# Patient Record
Sex: Female | Born: 1950 | Race: White | Hispanic: No | Marital: Married | Smoking: Never smoker
Health system: Southern US, Community
[De-identification: ages and names within clinical notes are randomized; demographics above are authoritative.]

## PROBLEM LIST (undated history)

## (undated) DIAGNOSIS — T7840XA Allergy, unspecified, initial encounter: Secondary | ICD-10-CM

## (undated) DIAGNOSIS — B009 Herpesviral infection, unspecified: Secondary | ICD-10-CM

## (undated) DIAGNOSIS — J189 Pneumonia, unspecified organism: Secondary | ICD-10-CM

## (undated) DIAGNOSIS — C801 Malignant (primary) neoplasm, unspecified: Secondary | ICD-10-CM

## (undated) DIAGNOSIS — M1990 Unspecified osteoarthritis, unspecified site: Secondary | ICD-10-CM

## (undated) DIAGNOSIS — K219 Gastro-esophageal reflux disease without esophagitis: Secondary | ICD-10-CM

## (undated) DIAGNOSIS — E785 Hyperlipidemia, unspecified: Secondary | ICD-10-CM

## (undated) DIAGNOSIS — I10 Essential (primary) hypertension: Secondary | ICD-10-CM

## (undated) HISTORY — DX: Allergy, unspecified, initial encounter: T78.40XA

## (undated) HISTORY — DX: Essential (primary) hypertension: I10

## (undated) HISTORY — PX: NASAL SEPTUM SURGERY: SHX37

## (undated) HISTORY — PX: WISDOM TOOTH EXTRACTION: SHX21

## (undated) HISTORY — DX: Hyperlipidemia, unspecified: E78.5

## (undated) HISTORY — PX: OTHER SURGICAL HISTORY: SHX169

## (undated) HISTORY — DX: Herpesviral infection, unspecified: B00.9

---
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
## 1998-04-03 ENCOUNTER — Ambulatory Visit (HOSPITAL_COMMUNITY): Admission: RE | Admit: 1998-04-03 | Discharge: 1998-04-03 | Payer: Self-pay | Admitting: *Deleted

## 1999-04-24 ENCOUNTER — Encounter: Admission: RE | Admit: 1999-04-24 | Discharge: 1999-04-24 | Payer: Self-pay | Admitting: Obstetrics and Gynecology

## 1999-04-24 ENCOUNTER — Encounter: Payer: Self-pay | Admitting: Obstetrics and Gynecology

## 2001-01-19 ENCOUNTER — Encounter: Admission: RE | Admit: 2001-01-19 | Discharge: 2001-01-19 | Payer: Self-pay | Admitting: Obstetrics and Gynecology

## 2001-01-19 ENCOUNTER — Encounter: Payer: Self-pay | Admitting: Obstetrics and Gynecology

## 2007-10-19 ENCOUNTER — Ambulatory Visit: Payer: Self-pay | Admitting: Internal Medicine

## 2008-01-21 ENCOUNTER — Ambulatory Visit: Payer: Self-pay | Admitting: Internal Medicine

## 2008-06-03 ENCOUNTER — Ambulatory Visit: Payer: Self-pay | Admitting: Internal Medicine

## 2008-08-09 ENCOUNTER — Ambulatory Visit: Payer: Self-pay | Admitting: Internal Medicine

## 2008-10-14 ENCOUNTER — Ambulatory Visit: Payer: Self-pay | Admitting: Internal Medicine

## 2009-06-09 ENCOUNTER — Ambulatory Visit: Payer: Self-pay | Admitting: Internal Medicine

## 2009-12-08 ENCOUNTER — Ambulatory Visit: Payer: Self-pay | Admitting: Internal Medicine

## 2010-07-01 ENCOUNTER — Other Ambulatory Visit: Payer: Self-pay | Admitting: Internal Medicine

## 2010-07-03 ENCOUNTER — Telehealth: Payer: Self-pay | Admitting: Internal Medicine

## 2010-07-03 NOTE — Telephone Encounter (Signed)
Fax Rx for Triamcinolone cream 0.1% 60 grams no refill to use sparingly tid to pharmacy

## 2010-07-04 ENCOUNTER — Other Ambulatory Visit: Payer: Self-pay

## 2010-07-04 MED ORDER — TRIAMCINOLONE ACETONIDE 0.025 % EX OINT: TOPICAL_OINTMENT | Freq: Three times a day (TID) | CUTANEOUS | Status: AC

## 2010-07-23 ENCOUNTER — Other Ambulatory Visit: Payer: Self-pay | Admitting: Internal Medicine

## 2010-07-24 ENCOUNTER — Encounter: Payer: Self-pay | Admitting: Internal Medicine

## 2010-07-26 ENCOUNTER — Encounter: Payer: Self-pay | Admitting: Internal Medicine

## 2010-07-29 ENCOUNTER — Other Ambulatory Visit: Payer: Self-pay | Admitting: Internal Medicine

## 2010-07-30 ENCOUNTER — Other Ambulatory Visit: Payer: Self-pay | Admitting: Internal Medicine

## 2010-07-30 ENCOUNTER — Other Ambulatory Visit: Payer: BC Managed Care – PPO | Admitting: Internal Medicine

## 2010-07-30 ENCOUNTER — Encounter: Payer: Self-pay | Admitting: Internal Medicine

## 2010-07-30 DIAGNOSIS — I10 Essential (primary) hypertension: Secondary | ICD-10-CM

## 2010-07-30 DIAGNOSIS — E785 Hyperlipidemia, unspecified: Secondary | ICD-10-CM

## 2010-07-30 DIAGNOSIS — Z0000 Encounter for general adult medical examination without abnormal findings: Secondary | ICD-10-CM

## 2010-07-30 LAB — CBC WITH DIFFERENTIAL/PLATELET
Basophils Absolute: 0 10*3/uL (ref 0.0–0.1)
Basophils Relative: 1 % (ref 0–1)
Eosinophils Absolute: 0.5 10*3/uL (ref 0.0–0.7)
Eosinophils Relative: 10 % — ABNORMAL HIGH (ref 0–5)
HCT: 42.6 % (ref 36.0–46.0)
Hemoglobin: 14 g/dL (ref 12.0–15.0)
Lymphocytes Relative: 35 % (ref 12–46)
Lymphs Abs: 1.6 10*3/uL (ref 0.7–4.0)
MCH: 30.8 pg (ref 26.0–34.0)
MCHC: 32.9 g/dL (ref 30.0–36.0)
MCV: 93.8 fL (ref 78.0–100.0)
Monocytes Absolute: 0.3 10*3/uL (ref 0.1–1.0)
Monocytes Relative: 5 % (ref 3–12)
Neutro Abs: 2.2 10*3/uL (ref 1.7–7.7)
Neutrophils Relative %: 48 % (ref 43–77)
Platelets: 212 10*3/uL (ref 150–400)
RBC: 4.54 MIL/uL (ref 3.87–5.11)
RDW: 14.2 % (ref 11.5–15.5)
WBC: 4.6 10*3/uL (ref 4.0–10.5)

## 2010-07-30 LAB — LIPID PANEL
Cholesterol: 192 mg/dL (ref 0–200)
HDL: 67 mg/dL (ref >39)
LDL Cholesterol: 104 mg/dL — ABNORMAL HIGH (ref 0–99)
Total CHOL/HDL Ratio: 2.9 Ratio
Triglycerides: 103 mg/dL (ref <150)
VLDL: 21 mg/dL (ref 0–40)

## 2010-07-30 LAB — COMPREHENSIVE METABOLIC PANEL
ALT: 18 U/L (ref 0–35)
AST: 19 U/L (ref 0–37)
Albumin: 4.6 g/dL (ref 3.5–5.2)
Alkaline Phosphatase: 37 U/L — ABNORMAL LOW (ref 39–117)
BUN: 19 mg/dL (ref 6–23)
Calcium: 9.5 mg/dL (ref 8.4–10.5)
Chloride: 103 mEq/L (ref 96–112)
Potassium: 3.7 mEq/L (ref 3.5–5.3)
Sodium: 139 mEq/L (ref 135–145)
Total Protein: 6.9 g/dL (ref 6.0–8.3)

## 2010-07-31 ENCOUNTER — Ambulatory Visit (INDEPENDENT_AMBULATORY_CARE_PROVIDER_SITE_OTHER): Payer: BC Managed Care – PPO | Admitting: Internal Medicine

## 2010-07-31 ENCOUNTER — Encounter: Payer: Self-pay | Admitting: Internal Medicine

## 2010-07-31 VITALS — BP 150/78 | HR 88 | Temp 98.8°F | Ht 62.0 in | Wt 158.0 lb

## 2010-07-31 DIAGNOSIS — I10 Essential (primary) hypertension: Secondary | ICD-10-CM

## 2010-07-31 DIAGNOSIS — H8109 Meniere's disease, unspecified ear: Secondary | ICD-10-CM

## 2010-07-31 DIAGNOSIS — L259 Unspecified contact dermatitis, unspecified cause: Secondary | ICD-10-CM

## 2010-07-31 DIAGNOSIS — Z0000 Encounter for general adult medical examination without abnormal findings: Secondary | ICD-10-CM

## 2010-07-31 LAB — POCT URINALYSIS DIPSTICK
Bilirubin, UA: NEGATIVE
Blood, UA: NEGATIVE
Ketones, UA: NEGATIVE
Protein, UA: NEGATIVE
Spec Grav, UA: 6
pH, UA: 1.03

## 2010-07-31 NOTE — Progress Notes (Signed)
Subjective:    Patient ID: Sally Hopkins, female    DOB: Aug 04, 1950, 60 y.o.   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 Patient says she had colonoscopy in the past by Dr. Juanda Chance. Doesn't recall the date of that visit. She needs to call Dr. Regino Schultze office and check on when next study is due. 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 She's had some stress at time with raccoons and birds in her chimney. Always seems to be a bit anxious. Has an element of white coat hypertension. Went to [place] last week with some friends and developed rash on her anterior lower legs which is very itchy. We prescribed triamcinolone cream for that. History of hypertension generally well controlled on Cozaar and metoprolol. Takes Hygroton 25 mg daily for history of Mnire's disease. Has chronic tinnitus and some vertigo that she basically just deals with. Dr. Elana Alm is GYN physician but he retired recently. I suspect her colonoscopy is past GU. By my records it was done August 2004 and was due to be repeated in 2009. Had bone density in 2009 which was normal. Mammogram done May 2012. Tetanus vaccine done June 2004. 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 She tried stopping it but had a recurrence. Had cryosurgery of her cervix in 2006. Recent lab work shows elevated fasting glucose of 114. We'll check hemoglobin A1c. Also on Zocor 10 mg daily for hyperlipidemia.    Review of Systems  Constitutional: Positive for fatigue.  HENT: Positive for sneezing.   Eyes: Negative.   Respiratory: Negative.   Cardiovascular: Negative.   Gastrointestinal: Negative.   Genitourinary: Negative.   Musculoskeletal: Negative.   Neurological: Positive for dizziness.       Chronic tinnitus and some vertigo secondary to Mnire's disease  Hematological: Negative.     Psychiatric/Behavioral:       Anxiety       Objective:   Physical Exam  Constitutional: She is oriented to person, place, and time. She appears well-developed and well-nourished. She appears distressed.  HENT:  Head: Normocephalic and atraumatic.  Right Ear: External ear normal.  Left Ear: External ear normal.  Mouth/Throat: Oropharynx is clear and moist. No oropharyngeal exudate.  Eyes: Conjunctivae and EOM are normal. Left eye exhibits no discharge. No scleral icterus.  Neck: Neck supple. No JVD present. No thyromegaly present.  Cardiovascular: Normal rate, regular rhythm and intact distal pulses.  Exam reveals no gallop.   Murmur heard.      2/6 systolic ejection murmur  Pulmonary/Chest: Effort normal and breath sounds normal. No respiratory distress. She has no wheezes. She has no rales. She exhibits no tenderness.       Breasts normal female  Abdominal: Soft. Bowel sounds are normal. She exhibits no distension and no mass. There is no tenderness. There is no rebound and no guarding.  Genitourinary:       Deferred to GYN physician  Musculoskeletal: She exhibits no edema.  Lymphadenopathy:    She has no cervical adenopathy.  Neurological: She is alert and oriented to person, place, and time. She has normal reflexes. No cranial nerve deficit.  Skin: Rash noted.       Fine papular rash anterior legs  Psychiatric: Judgment and thought content normal.       Anxious  Assessment & Plan:  Hypertension  Elevated fasting glucose  Contact dermatitis legs  History of HSV type II  History of Mnire's disease  Hyperlipidemia  Plan refill Cozaar 100 mg daily for 6 months if drugstore calls. Patient only likes to be seen on yearly basis because of expanse. I am not pleased with her blood pressure today and have asked her to come back for free blood pressure check in a couple of weeks. She is a bit anxious today. She is to check on colonoscopy. Okay to refill Valtrex  for one year if drugstore calls. She is on Zocor 10 mg daily for hyperlipidemia. Liver functions are normal.

## 2010-07-31 NOTE — Progress Notes (Signed)
  Subjective:    Patient ID: Sally Hopkins, female    DOB: 1950/03/16, 60 y.o.   MRN: [id number]  HPI  White female with Hx of HTN for PE. Has been at beach recently. Has papular itchy rash on legs. BP elevated today due in part to heat and stress. Fasting labs reviewed and fasting glucose is elevated at 114. Hgb AIC to be added. No immediate Family hx of DM.   Review of Systems  Constitutional: Positive for fatigue.  HENT: Positive for sneezing.   Eyes: Negative.   Respiratory: Negative.   Cardiovascular: Negative.   Gastrointestinal: Negative.   Genitourinary: Negative.   Musculoskeletal: Negative.   Neurological:       Chronic tinnitus with Hx Meniere's with some vertigo.  Hematological: Negative.   Psychiatric/Behavioral: Negative.        Objective:   Physical Exam        Assessment & Plan:

## 2010-07-31 NOTE — Patient Instructions (Signed)
Return in 2 weeks for blood pressure check at no charge otherwise see in 6 months for fasting lipid panel, ill closes [employer] at his he and he is a little and her and her and is an and a liver functions, blood pressure check and office visit. Please call Dr. Regino Schultze office and check when next colonoscopy is due

## 2010-08-28 ENCOUNTER — Other Ambulatory Visit: Payer: Self-pay | Admitting: Internal Medicine

## 2010-09-13 ENCOUNTER — Other Ambulatory Visit: Payer: Self-pay | Admitting: Internal Medicine

## 2010-10-29 ENCOUNTER — Encounter: Payer: Self-pay | Admitting: Internal Medicine

## 2011-02-04 ENCOUNTER — Ambulatory Visit: Payer: BC Managed Care – PPO | Admitting: Internal Medicine

## 2011-02-08 ENCOUNTER — Ambulatory Visit: Payer: BC Managed Care – PPO | Admitting: Internal Medicine

## 2011-04-24 ENCOUNTER — Ambulatory Visit: Payer: BC Managed Care – PPO | Admitting: Physician Assistant

## 2011-04-24 ENCOUNTER — Encounter: Payer: Self-pay | Admitting: Physician Assistant

## 2011-04-24 VITALS — BP 134/81 | HR 78 | Temp 98.0°F | Resp 16 | Ht 62.5 in | Wt 158.2 lb

## 2011-04-24 DIAGNOSIS — R059 Cough, unspecified: Secondary | ICD-10-CM

## 2011-04-24 DIAGNOSIS — J40 Bronchitis, not specified as acute or chronic: Secondary | ICD-10-CM

## 2011-04-24 DIAGNOSIS — R05 Cough: Secondary | ICD-10-CM

## 2011-04-24 MED ORDER — HYDROCODONE-HOMATROPINE 5-1.5 MG/5ML PO SYRP: ORAL_SOLUTION | ORAL | Status: AC

## 2011-04-24 MED ORDER — AZITHROMYCIN 250 MG PO TABS: ORAL_TABLET | ORAL | Status: AC

## 2011-04-24 NOTE — Progress Notes (Signed)
Patient ID: Sally Hopkins MRN: [id number], DOB: 10/05/1950, 61 y.o. Date of Encounter: 04/24/2011, 2:31 PM  Primary Physician: Margaree Mackintosh, MD, MD  Chief Complaint:  Chief Complaint  Patient presents with  . Fever    started last night  . Cough    dry cough since sun headache yesterday    HPI: 61 y.o. year old female presents with a 4 day history of  post nasal drip and cough. Mild sinus pressure today. Subjective fever and chills. Mild nasal congestion setting in. Cough is  Mostly non-productive, but occassionally productive of green/yellow sputum secondary to post nasal drip. No SOB, wheezing, palpitations, or edema. Ears feel full, leading to sensation of muffled hearing. Has tried OTC cold preps without success. No GI complaints. Appetite normal.  No sick contacts, recent antibiotics, or recent travels.   No leg trauma, sedentary periods, h/o cancer, or tobacco use.  Past Medical History  Diagnosis Date  . Hypertension   . Meniere's disease   . HSV-1 infection      Home Meds: Prior to Admission medications   Medication Sig Start Date End Date Taking? Authorizing Provider  calcium carbonate (OS-CAL) 600 MG TABS Take 600 mg by mouth 2 (two) times daily with a meal.     Yes Historical Provider, MD  cetirizine (ZYRTEC) 10 MG tablet Take 10 mg by mouth daily.     Yes Historical Provider, MD  chlorthalidone (HYGROTON) 25 MG tablet take 1 tablet by mouth once daily 07/01/10  Yes Margaree Mackintosh, MD  co-enzyme Q-10 50 MG capsule Take 50 mg by mouth daily.     Yes Historical Provider, MD  KLOR-CON M20 20 MEQ tablet take 1 tablet by mouth twice a day 08/28/10  Yes Margaree Mackintosh, MD  losartan (COZAAR) 100 MG tablet take 1 tablet by mouth once daily 08/28/10  Yes Margaree Mackintosh, MD  metoprolol (LOPRESSOR) 50 MG tablet Take 50 mg by mouth 2 (two) times daily.     Yes Historical Provider, MD  metoprolol (TOPROL-XL) 50 MG 24 hr tablet take 1 tablet by mouth once daily 07/29/10  Yes Margaree Mackintosh, MD  Multiple Vitamin (MULTIVITAMIN) capsule Take 1 capsule by mouth daily.     Yes Historical Provider, MD  phenylephrine (SUDAFED PE) 10 MG TABS Take 10 mg by mouth every 4 (four) hours as needed.     Yes Historical Provider, MD  simvastatin (ZOCOR) 10 MG tablet take 1 tablet by mouth once daily 07/29/10  Yes Margaree Mackintosh, MD  valACYclovir (VALTREX) 500 MG tablet take 1 tablet by mouth twice a day 09/13/10  Yes Margaree Mackintosh, MD  azithromycin (ZITHROMAX Z-PAK) 250 MG tablet 2 tabs po first day, then 1 tab po next 4 days 04/24/11 04/29/11  Sally Mutton Johnay Mano, PA-C  HYDROcodone-homatropine [facility]) 5-1.5 MG/5ML syrup 1 TSP PO Q 4-6 HOURS PRN COUGH 04/24/11 05/04/11  Sally Weniger M Vinh Sachs, PA-C  triamcinolone (KENALOG) 0.025 % ointment Apply topically 3 (three) times daily. 07/04/10 07/04/11  Margaree Mackintosh, MD    Allergies:  Allergies  Allergen Reactions  . Penicillins Rash    History   Social History  . Marital Status: Married    Spouse Name: N/A    Number of Children: N/A  . Years of Education: N/A   Occupational History  . Not on file.   Social History Main Topics  . Smoking status: Never Smoker   . Smokeless tobacco: Never Used  . Alcohol  Use: Yes     social  . Drug Use: No  . Sexually Active: Not on file   Other Topics Concern  . Not on file   Social History Narrative  . No narrative on file     Review of Systems: Constitutional: negative for night sweats or weight changes Cardiovascular: negative for chest pain or palpitations Respiratory: negative for hemoptysis, wheezing, or shortness of breath Abdominal: negative for abdominal pain, nausea, vomiting or diarrhea Dermatological: negative for rash Neurologic: negative for headache   Physical Exam: Blood pressure 134/81, pulse 78, temperature 98 F (36.7 C), temperature source Oral, resp. rate 16, height 5' 2.5" (1.588 m), weight 158 lb 3.2 oz (71.759 kg)., Body mass index is 28.47 kg/(m^2). General: Well developed, well  nourished, in no acute distress. Head: Normocephalic, atraumatic, eyes without discharge, sclera non-icteric, nares are congested. Bilateral auditory canals clear, TM's are without perforation, pearly grey with reflective cone of light bilaterally. No sinus TTP. Oral cavity moist, dentition normal. Posterior pharynx with post nasal drip and mild erythema. No peritonsillar abscess or tonsillar exudate. Neck: Supple. No thyromegaly. Full ROM. No lymphadenopathy. Lungs: Coarse breath sounds bilaterally without wheezes, rales, or rhonchi. Breathing is unlabored.  Heart: RRR with S1 S2. No murmurs, rubs, or gallops appreciated. Msk:  Strength and tone normal for age. Extremities: No clubbing or cyanosis. No edema. Neuro: Alert and oriented X 3. Moves all extremities spontaneously. CNII-XII grossly in tact. Psych:  Responds to questions appropriately with a normal affect.     ASSESSMENT AND PLAN:  61 y.o. year old female with bronchitis. -Azithromycin 250 MG #6 2 po first day then 1 po next 4 days no RF -Hycodan #4oz 1 tsp po q 4-6 hours prn cough no RF SED -Mucinex -Tylenol/Motrin prn -Rest/fluids -RTC precautions -RTC 3-5 days if no improvement  Signed, Sally Listen, PA-C 04/24/2011 2:31 PM

## 2011-06-15 ENCOUNTER — Other Ambulatory Visit: Payer: Self-pay | Admitting: Internal Medicine

## 2011-07-02 ENCOUNTER — Other Ambulatory Visit: Payer: Self-pay | Admitting: Internal Medicine

## 2011-07-02 NOTE — Telephone Encounter (Signed)
Please check and see when patient's last appt was. Have told her she needs yearly visit which she c/o about because of insurance deductible. She needs a yearly PE.

## 2011-07-03 ENCOUNTER — Other Ambulatory Visit: Payer: Self-pay

## 2011-07-03 MED ORDER — CHLORTHALIDONE 25 MG PO TABS: 25.0000 mg | ORAL_TABLET | Freq: Every day | ORAL | Status: DC

## 2011-07-03 NOTE — Telephone Encounter (Signed)
Hygroton refilled for 1 month only. Last OV 07/31/2010.

## 2011-07-29 ENCOUNTER — Other Ambulatory Visit: Payer: Self-pay | Admitting: Internal Medicine

## 2011-07-29 NOTE — Telephone Encounter (Signed)
Pt has PE scheduled for late August

## 2011-08-05 ENCOUNTER — Other Ambulatory Visit: Payer: Self-pay | Admitting: Internal Medicine

## 2011-08-30 ENCOUNTER — Other Ambulatory Visit: Payer: BC Managed Care – PPO | Admitting: Internal Medicine

## 2011-08-30 DIAGNOSIS — E785 Hyperlipidemia, unspecified: Secondary | ICD-10-CM

## 2011-08-30 DIAGNOSIS — I10 Essential (primary) hypertension: Secondary | ICD-10-CM

## 2011-08-30 LAB — CBC WITH DIFFERENTIAL/PLATELET
Basophils Absolute: 0 10*3/uL (ref 0.0–0.1)
Basophils Relative: 1 % (ref 0–1)
Eosinophils Absolute: 0.3 10*3/uL (ref 0.0–0.7)
MCH: 31.3 pg (ref 26.0–34.0)
MCHC: 35.2 g/dL (ref 30.0–36.0)
Neutro Abs: 2.2 10*3/uL (ref 1.7–7.7)
Neutrophils Relative %: 51 % (ref 43–77)
Platelets: 217 10*3/uL (ref 150–400)
RDW: 13 % (ref 11.5–15.5)

## 2011-08-30 LAB — COMPREHENSIVE METABOLIC PANEL
AST: 24 U/L (ref 0–37)
Alkaline Phosphatase: 38 U/L — ABNORMAL LOW (ref 39–117)
Glucose, Bld: 101 mg/dL — ABNORMAL HIGH (ref 70–99)
Sodium: 139 mEq/L (ref 135–145)
Total Bilirubin: 0.7 mg/dL (ref 0.3–1.2)
Total Protein: 7.2 g/dL (ref 6.0–8.3)

## 2011-08-30 LAB — LIPID PANEL
LDL Cholesterol: 106 mg/dL — ABNORMAL HIGH (ref 0–99)
Triglycerides: 97 mg/dL (ref <150)
VLDL: 19 mg/dL (ref 0–40)

## 2011-08-30 LAB — TSH: TSH: 2.273 u[IU]/mL (ref 0.350–4.500)

## 2011-09-02 ENCOUNTER — Encounter: Payer: Self-pay | Admitting: Internal Medicine

## 2011-09-02 ENCOUNTER — Telehealth: Payer: Self-pay | Admitting: Internal Medicine

## 2011-09-02 ENCOUNTER — Ambulatory Visit (INDEPENDENT_AMBULATORY_CARE_PROVIDER_SITE_OTHER): Payer: BC Managed Care – PPO | Admitting: Internal Medicine

## 2011-09-02 VITALS — BP 136/80 | HR 74 | Resp 16 | Ht 62.5 in | Wt 158.0 lb

## 2011-09-02 DIAGNOSIS — H8109 Meniere's disease, unspecified ear: Secondary | ICD-10-CM

## 2011-09-02 DIAGNOSIS — I10 Essential (primary) hypertension: Secondary | ICD-10-CM

## 2011-09-02 DIAGNOSIS — J309 Allergic rhinitis, unspecified: Secondary | ICD-10-CM

## 2011-09-02 DIAGNOSIS — E785 Hyperlipidemia, unspecified: Secondary | ICD-10-CM

## 2011-09-02 DIAGNOSIS — Z0000 Encounter for general adult medical examination without abnormal findings: Secondary | ICD-10-CM

## 2011-09-02 DIAGNOSIS — B009 Herpesviral infection, unspecified: Secondary | ICD-10-CM

## 2011-09-02 LAB — POCT URINALYSIS DIPSTICK
Ketones, UA: NEGATIVE
Leukocytes, UA: NEGATIVE
Protein, UA: NEGATIVE
Spec Grav, UA: 1.01
pH, UA: 5

## 2011-09-02 MED ORDER — POTASSIUM CHLORIDE CRYS ER 20 MEQ PO TBCR: 20.0000 meq | EXTENDED_RELEASE_TABLET | Freq: Two times a day (BID) | ORAL | Status: DC

## 2011-09-02 MED ORDER — CHLORTHALIDONE 25 MG PO TABS: 25.0000 mg | ORAL_TABLET | Freq: Every day | ORAL | Status: DC

## 2011-09-02 MED ORDER — LOSARTAN POTASSIUM 100 MG PO TABS: 100.0000 mg | ORAL_TABLET | Freq: Every day | ORAL | Status: DC

## 2011-09-02 MED ORDER — VALACYCLOVIR HCL 500 MG PO TABS: 500.0000 mg | ORAL_TABLET | Freq: Two times a day (BID) | ORAL | Status: DC

## 2011-09-02 NOTE — Telephone Encounter (Signed)
Rx put in the mail to go out on Tuesday a.m. (8/27).

## 2011-09-02 NOTE — Telephone Encounter (Signed)
Order written for Zostavax vaccine

## 2011-09-08 DIAGNOSIS — J309 Allergic rhinitis, unspecified: Secondary | ICD-10-CM | POA: Insufficient documentation

## 2011-09-08 DIAGNOSIS — E785 Hyperlipidemia, unspecified: Secondary | ICD-10-CM | POA: Insufficient documentation

## 2011-09-08 DIAGNOSIS — B009 Herpesviral infection, unspecified: Secondary | ICD-10-CM | POA: Insufficient documentation

## 2011-09-08 NOTE — Patient Instructions (Addendum)
Continue same medications and return in one year. 

## 2011-09-08 NOTE — Progress Notes (Signed)
  Subjective:    Patient ID: Sally Hopkins, female    DOB: 1950-12-24, 61 y.o.   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 Patient had cryosurgery of the cervix November 2006. Is allergic Thimersol-- it causes a rash and also allergic to penicillin-causes a rash. Some mycin drugs cause rashes in this patient. Patient had a bone density study March 2009. Had a colonoscopy with Dr. Juanda Chance in August 2004. Had tetanus immunization 07/05/2002  She is married has 2 adult children. Does not smoke. Drinks about 8 ounces of wine daily. Graduate of [employer].  Family history: Father died at age 67 of ALS but also had history of colon cancer. Mother with history of open heart surgery 1969 for congenital heart disease and also has had coronary artery bypass graft surgery. One sister in good health.    Review of Systems  Constitutional: Negative.   All other systems reviewed and are negative.       Objective:   Physical Exam  Vitals reviewed. Constitutional: She is oriented to person, place, and time. She appears well-developed and well-nourished.  HENT:  Head: Normocephalic and atraumatic.  Right Ear: External ear normal.  Left Ear: External ear normal.  Mouth/Throat: Oropharynx is clear and moist. No oropharyngeal exudate.  Eyes: Conjunctivae and EOM are normal. Pupils are equal, round, and reactive to light. Right eye exhibits no discharge. Left eye exhibits no discharge. No scleral icterus.  Neck: Normal range of motion. Neck supple. No JVD present. No thyromegaly present.  Cardiovascular: Normal rate, regular rhythm, normal heart sounds and intact distal pulses.   No murmur heard. Pulmonary/Chest: Effort normal and breath sounds normal. No respiratory distress. She has no  wheezes. She has no rales.       Breasts normal female  Abdominal: Soft. Bowel sounds are normal. She exhibits no distension and no mass. There is no tenderness. There is no rebound and no guarding.  Musculoskeletal: Normal range of motion. She exhibits no edema.  Lymphadenopathy:    She has no cervical adenopathy.  Neurological: She is alert and oriented to person, place, and time. She has normal reflexes. No cranial nerve deficit. Coordination normal.  Skin: Skin is warm and dry. No rash noted.  Psychiatric: She has a normal mood and affect. Her behavior is normal. Judgment and thought content normal.          Assessment & Plan:  Hypertension-well-controlled on Toprol, Cozaar, chlorthalidone  Hyperlipidemia-stable on Zocor  HSV type II-takes daily Valtrex for prophylaxis  Hypokalemia on diuretic therapy- takes K-Dur 20 MA every 12 hours daily  Allergic rhinitis-stable with over-the-counter antihistamine-warm patient be careful taking decongestants with history of hypertension  History of Mnire's disease-apparently in remission with no recent episodes  Plan: Patient wants to return in one year or as needed instead of Q6 months. Needs to have annual mammogram. Next year will need to have colonoscopy per Dr. Juanda Chance. She should contact that office. Recommend repeat bone density study within the next year. All medications have been refilled for one year.

## 2012-07-28 ENCOUNTER — Other Ambulatory Visit: Payer: Self-pay | Admitting: Internal Medicine

## 2012-07-28 NOTE — Telephone Encounter (Signed)
Have refilled once-Time for PE?

## 2012-07-28 NOTE — Telephone Encounter (Signed)
CPE scheduled for 09/10/2012 at 10:00 am

## 2012-08-31 ENCOUNTER — Other Ambulatory Visit: Payer: Self-pay | Admitting: Internal Medicine

## 2012-09-01 ENCOUNTER — Other Ambulatory Visit: Payer: Self-pay

## 2012-09-01 ENCOUNTER — Other Ambulatory Visit: Payer: Self-pay | Admitting: Internal Medicine

## 2012-09-04 ENCOUNTER — Other Ambulatory Visit: Payer: Self-pay | Admitting: Internal Medicine

## 2012-09-10 ENCOUNTER — Encounter: Payer: Self-pay | Admitting: Internal Medicine

## 2012-09-10 ENCOUNTER — Other Ambulatory Visit: Payer: BC Managed Care – PPO | Admitting: Internal Medicine

## 2012-09-10 ENCOUNTER — Ambulatory Visit (INDEPENDENT_AMBULATORY_CARE_PROVIDER_SITE_OTHER): Payer: BC Managed Care – PPO | Admitting: Internal Medicine

## 2012-09-10 VITALS — BP 138/88 | HR 80 | Ht 62.0 in | Wt 158.0 lb

## 2012-09-10 DIAGNOSIS — Z0000 Encounter for general adult medical examination without abnormal findings: Secondary | ICD-10-CM

## 2012-09-10 DIAGNOSIS — Z130 Encounter for screening for diseases of the blood and blood-forming organs and certain disorders involving the immune mechanism: Secondary | ICD-10-CM

## 2012-09-10 DIAGNOSIS — E785 Hyperlipidemia, unspecified: Secondary | ICD-10-CM

## 2012-09-10 DIAGNOSIS — Z8669 Personal history of other diseases of the nervous system and sense organs: Secondary | ICD-10-CM

## 2012-09-10 DIAGNOSIS — Z1329 Encounter for screening for other suspected endocrine disorder: Secondary | ICD-10-CM

## 2012-09-10 DIAGNOSIS — J309 Allergic rhinitis, unspecified: Secondary | ICD-10-CM

## 2012-09-10 DIAGNOSIS — B009 Herpesviral infection, unspecified: Secondary | ICD-10-CM

## 2012-09-10 DIAGNOSIS — I10 Essential (primary) hypertension: Secondary | ICD-10-CM

## 2012-09-10 DIAGNOSIS — Z79899 Other long term (current) drug therapy: Secondary | ICD-10-CM

## 2012-09-10 DIAGNOSIS — Z23 Encounter for immunization: Secondary | ICD-10-CM

## 2012-09-10 DIAGNOSIS — Z13228 Encounter for screening for other metabolic disorders: Secondary | ICD-10-CM

## 2012-09-10 LAB — COMPREHENSIVE METABOLIC PANEL
ALT: 21 U/L (ref 0–35)
Albumin: 4.7 g/dL (ref 3.5–5.2)
Alkaline Phosphatase: 39 U/L (ref 39–117)
Potassium: 3.8 mEq/L (ref 3.5–5.3)
Sodium: 138 mEq/L (ref 135–145)
Total Bilirubin: 0.7 mg/dL (ref 0.3–1.2)
Total Protein: 7.1 g/dL (ref 6.0–8.3)

## 2012-09-10 LAB — CBC WITH DIFFERENTIAL/PLATELET
Basophils Relative: 1 % (ref 0–1)
Eosinophils Absolute: 0.3 10*3/uL (ref 0.0–0.7)
Hemoglobin: 13.7 g/dL (ref 12.0–15.0)
Lymphs Abs: 1.5 10*3/uL (ref 0.7–4.0)
MCH: 30.8 pg (ref 26.0–34.0)
MCHC: 33.8 g/dL (ref 30.0–36.0)
Monocytes Relative: 7 % (ref 3–12)
Neutro Abs: 2.7 10*3/uL (ref 1.7–7.7)
Neutrophils Relative %: 55 % (ref 43–77)
Platelets: 210 10*3/uL (ref 150–400)
RBC: 4.45 MIL/uL (ref 3.87–5.11)

## 2012-09-10 LAB — POCT URINALYSIS DIPSTICK
Bilirubin, UA: NEGATIVE
Blood, UA: NEGATIVE
Glucose, UA: NEGATIVE
Leukocytes, UA: NEGATIVE

## 2012-09-10 LAB — LIPID PANEL
LDL Cholesterol: 113 mg/dL — ABNORMAL HIGH (ref 0–99)
VLDL: 27 mg/dL (ref 0–40)

## 2012-09-10 LAB — TSH: TSH: 2.619 u[IU]/mL (ref 0.350–4.500)

## 2012-09-10 MED ORDER — POTASSIUM CHLORIDE CRYS ER 20 MEQ PO TBCR: 20.0000 meq | EXTENDED_RELEASE_TABLET | Freq: Two times a day (BID) | ORAL | Status: DC

## 2012-09-10 MED ORDER — VALACYCLOVIR HCL 500 MG PO TABS: 500.0000 mg | ORAL_TABLET | Freq: Two times a day (BID) | ORAL | Status: DC

## 2012-09-10 MED ORDER — CHLORTHALIDONE 25 MG PO TABS: 25.0000 mg | ORAL_TABLET | Freq: Every day | ORAL | Status: DC

## 2012-09-10 MED ORDER — LOSARTAN POTASSIUM 100 MG PO TABS: 100.0000 mg | ORAL_TABLET | Freq: Every day | ORAL | Status: DC

## 2012-09-10 MED ORDER — METOPROLOL SUCCINATE ER 50 MG PO TB24: 50.0000 mg | ORAL_TABLET | Freq: Every day | ORAL | Status: DC

## 2012-09-10 MED ORDER — SIMVASTATIN 10 MG PO TABS: 10.0000 mg | ORAL_TABLET | Freq: Every day | ORAL | Status: DC

## 2012-09-10 NOTE — Progress Notes (Signed)
Subjective:    Patient ID: Sally Hopkins, female    DOB: 11-02-1950, 62 y.o.   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 She has yet to get Zostavax vaccine because she has some questions. 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 Would be ideal for her to stop that 24 hours before up to 14 days after the vaccine but she is concerned about an outbreak. Says she is allergic to thimerosal needs to take thimerosal free flu vaccine. Our vaccine is free of thimerosal but she doesn't want to take it at this point in time thinking it is too early; however it is not too early. She did agree to take to get Tdap vaccine today. Dr. Elana Alm retired she has not found a new GYN physician. Recommendations were given.  Patient has a history of hyperlipidemia and hypertension. She is on losartan, metoprolol, simvastatin, chlorthalidone. Says blood pressure runs better at home that does here. She has a history of allergic rhinitis for which he takes Zyrtec. She takes potassium supplementation because she is also on diuretic therapy   Patient apparently had mammogram in June of this year. This was done at [facility]. Is not on file an Epic. Last one on file an Epic is 2013 Patient had bone density study in 2009.  Patient had colonoscopy by Dr. Juanda Chance in 2004 in followup is due this year.  She has a history of Mnire's disease in addition to hypertension and hyperlipidemia. Mnire's disease is stable and in remission. History of allergic rhinitis.  In 2006, she had cryosurgery of the cervix.  Is allergic to penicillin, thimerosal, and apparently some mycin drugs. All of these cause rashes.  She is married and has 2 adult children. Does not smoke. Drinks about 8 ounces of wine daily. Graduate [employer].  Family history: Father died at 57 of ALS but also had history of colon  cancer. Mother with history of open heart surgery in 1969 for congenital heart disease and also has had coronary artery bypass graft surgery. One sister in good health.   Review of Systems  Constitutional: Negative.   All other systems reviewed and are negative.       Objective:   Physical Exam  Vitals reviewed. Constitutional: She is oriented to person, place, and time. No distress.  Eyes: Pupils are equal, round, and reactive to light.  Cardiovascular:  No murmur heard. Abdominal: Soft. Bowel sounds are normal.  Musculoskeletal: Normal range of motion. She exhibits no edema.  Neurological: She is alert and oriented to person, place, and time. She has normal reflexes. No cranial nerve deficit. Coordination normal.  Skin: Skin is warm and dry. No rash noted. She is not diaphoretic.  Psychiatric: She has a normal mood and affect. Her behavior is normal. Judgment and thought content normal.          Assessment & Plan:  Hypertension  Hyperlipidemia  History of herpes simplex type II  Allergic rhinitis  History of Mnire's disease in remission  Plan: Return in one year or as needed. Continue same medications. Fasting labs are pending. Tetanus immunization update given today. She should check with Dr. Juanda Chance regarding repeat colonoscopy. She may obtain influenza vaccine either here or [facility].  Order written for her to consider Zostavax vaccine. She has some reservations about it.  Patient needs to establish with GYN physician because of history of cervical  cryosurgery 2006. No recent Pap smear. Recommend annual mammogram. No bone density study since 2009. This needs to be repeated.

## 2012-09-10 NOTE — Addendum Note (Signed)
Addended by: Judy Pimple on: 09/10/2012 11:51 AM   Modules accepted: Orders

## 2012-09-10 NOTE — Addendum Note (Signed)
Addended by: Judy Pimple on: 09/10/2012 12:29 PM   Modules accepted: Orders

## 2012-09-10 NOTE — Patient Instructions (Addendum)
Continue same medications and return in one year. Order written for bone density study. Order written for Zostavax vaccine.

## 2012-09-11 LAB — VITAMIN D 25 HYDROXY (VIT D DEFICIENCY, FRACTURES): Vit D, 25-Hydroxy: 72 ng/mL (ref 30–89)

## 2012-10-26 ENCOUNTER — Other Ambulatory Visit: Payer: Self-pay | Admitting: Internal Medicine

## 2013-02-26 ENCOUNTER — Other Ambulatory Visit: Payer: Self-pay | Admitting: Internal Medicine

## 2013-03-27 ENCOUNTER — Ambulatory Visit: Payer: BC Managed Care – PPO | Admitting: Family Medicine

## 2013-03-27 VITALS — BP 120/90 | HR 62 | Temp 98.0°F | Resp 18 | Wt 153.0 lb

## 2013-03-27 DIAGNOSIS — J329 Chronic sinusitis, unspecified: Secondary | ICD-10-CM

## 2013-03-27 MED ORDER — LEVOFLOXACIN 500 MG PO TABS: 500.0000 mg | ORAL_TABLET | Freq: Every day | ORAL | Status: DC

## 2013-03-27 NOTE — Progress Notes (Signed)
@[email or web address] @  Patient ID: Sally Hopkins MRN: [id number], DOB: May 20, 1950, 63 y.o. Date of Encounter: 03/27/2013, 2:07 PM  Primary Physician: Elby Showers, MD  Chief Complaint: sinusitis  HPI: 63 y.o. year old female with history below presents with concerns of a sinus infection, onset 5 days ago. Reports associated coughing. Pt states her son is getting married in one week and she would like to treat symptoms before then. 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   Past Medical History  Diagnosis Date   Hypertension    Meniere's disease    HSV-1 infection      Home Meds: Prior to Admission medications   Medication Sig Start Date End Date Taking? Authorizing Provider  calcium carbonate (OS-CAL) 600 MG TABS Take 600 mg by mouth 2 (two) times daily with a meal.     Yes Historical Provider, MD  cetirizine (ZYRTEC) 10 MG tablet Take 10 mg by mouth daily.     Yes Historical Provider, MD  chlorthalidone (HYGROTON) 25 MG tablet Take 1 tablet (25 mg total) by mouth daily. 09/10/12  Yes Elby Showers, MD  cholecalciferol (VITAMIN D) 1000 UNITS tablet Take 1,000 Units by mouth every other day.   Yes Historical Provider, MD  co-enzyme Q-10 50 MG capsule Take 50 mg by mouth daily.     Yes Historical Provider, MD  losartan (COZAAR) 100 MG tablet take 1 tablet by mouth once daily 02/26/13  Yes Elby Showers, MD  metoprolol succinate (TOPROL-XL) 50 MG 24 hr tablet Take 1 tablet (50 mg total) by mouth daily. Take with or immediately following a meal. 09/10/12  Yes Elby Showers, MD  Multiple Vitamin (MULTIVITAMIN) capsule Take 1 capsule by mouth daily.     Yes Historical Provider, MD  phenylephrine (SUDAFED PE) 10 MG TABS Take 10 mg by mouth every 4 (four) hours as needed.     Yes Historical Provider, MD  potassium chloride SA (KLOR-CON M20) 20 MEQ tablet Take 1 tablet (20 mEq total) by mouth 2 (two) times daily. 09/10/12  Yes Elby Showers, MD  simvastatin (ZOCOR) 10 MG tablet Take 1 tablet (10 mg total)  by mouth at bedtime. 09/10/12  Yes Elby Showers, MD  valACYclovir (VALTREX) 500 MG tablet Take 1 tablet (500 mg total) by mouth 2 (two) times daily. 09/10/12  Yes Elby Showers, MD    Allergies:  Allergies  Allergen Reactions   Penicillins Rash    History   Social History   Marital Status: Married    Spouse Name: N/A    Number of Children: N/A   Years of Education: N/A   Occupational History   Not on file.   Social History Main Topics   Smoking status: Never Smoker    Smokeless tobacco: Never Used   Alcohol Use: Yes     Comment: social   Drug Use: No   Sexual Activity: Not on file   Other Topics Concern   Not on file   Social History Narrative   No narrative on file     Review of Systems: Constitutional: negative for chills, fever, night sweats, weight changes, or fatigue  HEENT: negative for vision changes, hearing loss, congestion, rhinorrhea, ST, epistaxis. Positive for sinus pressure Cardiovascular: negative for chest pain or palpitations Respiratory: negative for hemoptysis, wheezing, shortness of breath. Positive for cough Abdominal: negative for abdominal pain, nausea, vomiting, diarrhea, or constipation Dermatological: negative for rash Neurologic: negative for headache, dizziness, or syncope All other systems reviewed  and are otherwise negative with the exception to those above and in the HPI.   Physical Exam: Blood pressure 120/90, pulse 62, temperature 98 F (36.7 C), temperature source Oral, resp. rate 18, weight 153 lb (69.4 kg), SpO2 99.00%., Body mass index is 27.98 kg/(m^2). General: Well developed, well nourished, in no acute distress. Head: Normocephalic, atraumatic, eyes without discharge, sclera non-icteric, nares are without discharge. Bilateral auditory canals clear, TM's are without perforation, pearly grey and translucent with reflective cone of light bilaterally. Oral cavity moist, posterior pharynx without exudate, erythema,  peritonsillar abscess, or post nasal drip.  Neck: Supple. No thyromegaly. Full ROM. No lymphadenopathy. Lungs: Clear bilaterally to auscultation without wheezes, rales, or rhonchi. Breathing is unlabored. Heart: RRR with S1 S2. No murmurs, rubs, or gallops appreciated. Abdomen: Soft, non-tender, non-distended with normoactive bowel sounds. No hepatomegaly. No rebound/guarding. No obvious abdominal masses. Msk:  Strength and tone normal for age. Extremities/Skin: Warm and dry. No clubbing or cyanosis. No edema. No rashes or suspicious lesions. Neuro: Alert and oriented X 3. Moves all extremities spontaneously. Gait is normal. CNII-XII grossly in tact. Psych:  Responds to questions appropriately with a normal affect.    ASSESSMENT AND PLAN:  63 y.o. year old female with Sinusitis - Plan: levofloxacin (LEVAQUIN) 500 MG tablet     Signed, Robyn Haber, MD 03/27/2013 2:07 PM

## 2013-03-29 ENCOUNTER — Ambulatory Visit: Payer: BC Managed Care – PPO | Admitting: Internal Medicine

## 2013-09-10 ENCOUNTER — Other Ambulatory Visit: Payer: Self-pay | Admitting: Internal Medicine

## 2013-09-10 ENCOUNTER — Other Ambulatory Visit: Payer: BC Managed Care – PPO | Admitting: Internal Medicine

## 2013-09-10 DIAGNOSIS — Z0000 Encounter for general adult medical examination without abnormal findings: Secondary | ICD-10-CM

## 2013-09-10 DIAGNOSIS — Z131 Encounter for screening for diabetes mellitus: Secondary | ICD-10-CM

## 2013-09-10 DIAGNOSIS — Z1329 Encounter for screening for other suspected endocrine disorder: Secondary | ICD-10-CM

## 2013-09-10 DIAGNOSIS — Z130 Encounter for screening for diseases of the blood and blood-forming organs and certain disorders involving the immune mechanism: Secondary | ICD-10-CM

## 2013-09-10 DIAGNOSIS — I10 Essential (primary) hypertension: Secondary | ICD-10-CM

## 2013-09-10 DIAGNOSIS — Z13228 Encounter for screening for other metabolic disorders: Secondary | ICD-10-CM

## 2013-09-10 DIAGNOSIS — Z13220 Encounter for screening for lipoid disorders: Secondary | ICD-10-CM

## 2013-09-10 LAB — CBC WITH DIFFERENTIAL/PLATELET
BASOS ABS: 0 10*3/uL (ref 0.0–0.1)
Basophils Relative: 1 % (ref 0–1)
Eosinophils Absolute: 0.3 10*3/uL (ref 0.0–0.7)
Eosinophils Relative: 6 % — ABNORMAL HIGH (ref 0–5)
HCT: 40.5 % (ref 36.0–46.0)
Hemoglobin: 13.9 g/dL (ref 12.0–15.0)
LYMPHS PCT: 35 % (ref 12–46)
Lymphs Abs: 1.6 10*3/uL (ref 0.7–4.0)
MCH: 30.6 pg (ref 26.0–34.0)
MCHC: 34.3 g/dL (ref 30.0–36.0)
MCV: 89.2 fL (ref 78.0–100.0)
Monocytes Absolute: 0.4 10*3/uL (ref 0.1–1.0)
Monocytes Relative: 8 % (ref 3–12)
NEUTROS ABS: 2.4 10*3/uL (ref 1.7–7.7)
NEUTROS PCT: 50 % (ref 43–77)
PLATELETS: 221 10*3/uL (ref 150–400)
RBC: 4.54 MIL/uL (ref 3.87–5.11)
RDW: 13.6 % (ref 11.5–15.5)
WBC: 4.7 10*3/uL (ref 4.0–10.5)

## 2013-09-10 LAB — COMPREHENSIVE METABOLIC PANEL
ALT: 20 U/L (ref 0–35)
AST: 17 U/L (ref 0–37)
Albumin: 4.5 g/dL (ref 3.5–5.2)
Alkaline Phosphatase: 39 U/L (ref 39–117)
BUN: 22 mg/dL (ref 6–23)
CHLORIDE: 103 meq/L (ref 96–112)
CO2: 27 mEq/L (ref 19–32)
Calcium: 10.2 mg/dL (ref 8.4–10.5)
Creat: 0.7 mg/dL (ref 0.50–1.10)
Glucose, Bld: 112 mg/dL — ABNORMAL HIGH (ref 70–99)
Potassium: 3.9 mEq/L (ref 3.5–5.3)
Sodium: 139 mEq/L (ref 135–145)
Total Bilirubin: 0.7 mg/dL (ref 0.2–1.2)
Total Protein: 7.1 g/dL (ref 6.0–8.3)

## 2013-09-10 LAB — LIPID PANEL
CHOLESTEROL: 188 mg/dL (ref 0–200)
HDL: 71 mg/dL (ref >39)
LDL Cholesterol: 97 mg/dL (ref 0–99)
TRIGLYCERIDES: 98 mg/dL (ref <150)
Total CHOL/HDL Ratio: 2.6 Ratio
VLDL: 20 mg/dL (ref 0–40)

## 2013-09-10 LAB — TSH: TSH: 2.791 u[IU]/mL (ref 0.350–4.500)

## 2013-09-11 LAB — VITAMIN D 25 HYDROXY (VIT D DEFICIENCY, FRACTURES): VIT D 25 HYDROXY: 73 ng/mL (ref 30–89)

## 2013-09-16 ENCOUNTER — Telehealth: Payer: Self-pay

## 2013-09-16 ENCOUNTER — Encounter: Payer: Self-pay | Admitting: Internal Medicine

## 2013-09-16 ENCOUNTER — Ambulatory Visit (INDEPENDENT_AMBULATORY_CARE_PROVIDER_SITE_OTHER): Payer: BC Managed Care – PPO | Admitting: Internal Medicine

## 2013-09-16 VITALS — BP 132/82 | HR 70 | Ht 62.0 in | Wt 155.0 lb

## 2013-09-16 DIAGNOSIS — B009 Herpesviral infection, unspecified: Secondary | ICD-10-CM

## 2013-09-16 DIAGNOSIS — E785 Hyperlipidemia, unspecified: Secondary | ICD-10-CM

## 2013-09-16 DIAGNOSIS — J309 Allergic rhinitis, unspecified: Secondary | ICD-10-CM

## 2013-09-16 DIAGNOSIS — I10 Essential (primary) hypertension: Secondary | ICD-10-CM

## 2013-09-16 DIAGNOSIS — Z8669 Personal history of other diseases of the nervous system and sense organs: Secondary | ICD-10-CM

## 2013-09-16 DIAGNOSIS — Z0000 Encounter for general adult medical examination without abnormal findings: Secondary | ICD-10-CM

## 2013-09-16 LAB — HEMOGLOBIN A1C
Hgb A1c MFr Bld: 5.6 % (ref <5.7)
Mean Plasma Glucose: 114 mg/dL (ref <117)

## 2013-09-16 LAB — POCT URINALYSIS DIPSTICK
Bilirubin, UA: NEGATIVE
Blood, UA: NEGATIVE
Glucose, UA: NEGATIVE
Ketones, UA: NEGATIVE
Leukocytes, UA: NEGATIVE
Nitrite, UA: NEGATIVE
PH UA: 7.5
Protein, UA: NEGATIVE
Spec Grav, UA: 1.01
Urobilinogen, UA: NEGATIVE

## 2013-09-16 MED ORDER — LOSARTAN POTASSIUM 100 MG PO TABS: ORAL_TABLET | ORAL | Status: DC

## 2013-09-16 MED ORDER — METOPROLOL SUCCINATE ER 50 MG PO TB24: 50.0000 mg | ORAL_TABLET | Freq: Every day | ORAL | Status: DC

## 2013-09-16 MED ORDER — CHLORTHALIDONE 25 MG PO TABS: 25.0000 mg | ORAL_TABLET | Freq: Every day | ORAL | Status: DC

## 2013-09-16 MED ORDER — SIMVASTATIN 10 MG PO TABS: 10.0000 mg | ORAL_TABLET | Freq: Every day | ORAL | Status: DC

## 2013-09-16 MED ORDER — VALACYCLOVIR HCL 500 MG PO TABS: 500.0000 mg | ORAL_TABLET | Freq: Two times a day (BID) | ORAL | Status: DC

## 2013-09-16 NOTE — Telephone Encounter (Signed)
Per Dr Renold Genta A1C added to patients lab.

## 2013-09-25 ENCOUNTER — Other Ambulatory Visit: Payer: Self-pay | Admitting: Internal Medicine

## 2013-10-07 NOTE — Progress Notes (Signed)
   Subjective:    Patient ID: Sally Hopkins, female    DOB: January 01, 1951, 63 y.o.   MRN: [id number]  HPI  63 year old white female in today for health maintenance and evaluation of medical issues. 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 Says she is allergic to thimerosal which is a preservative in vaccines.  Patient has a history of hyperlipidemia and hypertension. She is on losartan and metoprolol as well as simvastatin and chlorthalidone. History of allergic rhinitis. She takes potassium supplement due to hypokalemia from diuretic therapy.  Patient had colonoscopy by Dr. Olevia Perches in 2004. Needs to followup. She says she has a history of Mnire's disease. Says it is stable and in remission. In 2006 she had cryosurgery of the cervix.  She is allergic to penicillin, thimerosal and apparently some mycin drugs. All of these cause rashes.  Social history: She is married with 2 adult children. Does not smoke. Drinks about 8 ounces of wine daily. Graduate of [employer]. 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  Family history: Father died at 46 of ALS but also had history of colon cancer. Mother with history of open heart surgery 1969 for congenital heart disease and also as a coronary artery bypass surgery. One sister in good health.    Review of Systems  Constitutional: Negative.   All other systems reviewed and are negative.      Objective:   Physical Exam  Vitals reviewed. Constitutional: She is oriented to person, place, and time. She appears well-developed and well-nourished. No distress.  HENT:  Head: Normocephalic and atraumatic.  Right Ear: External ear normal.  Left Ear: External ear normal.  Mouth/Throat: Oropharynx is clear and moist. No oropharyngeal exudate.  Eyes: Conjunctivae and EOM are normal. Pupils are equal, round, and reactive to light. Right eye exhibits no discharge. Left eye exhibits no discharge. No scleral icterus.  Neck:  No JVD present. No thyromegaly present.  Cardiovascular: Normal rate, regular rhythm, normal heart sounds and intact distal pulses.   No murmur heard. Pulmonary/Chest: Breath sounds normal. No respiratory distress. She has no wheezes. She has no rales. She exhibits no tenderness.  Abdominal: Soft. Bowel sounds are normal. She exhibits no distension and no mass. There is no tenderness. There is no guarding.  Musculoskeletal: Normal range of motion. She exhibits no edema.  Lymphadenopathy:    She has no cervical adenopathy.  Neurological: She is alert and oriented to person, place, and time. She has normal reflexes. She displays normal reflexes. No cranial nerve deficit. Coordination normal.  Skin: Skin is warm and dry. No rash noted. She is not diaphoretic.  Psychiatric: She has a normal mood and affect. Her behavior is normal. Judgment and thought content normal.          Assessment & Plan:  Hypertension  Hyperlipidemia  History of Mnire's disease  History of herpes simplex type II  Allergic rhinitis  Thimerosal allergy  Plan: Return in one year or as needed.  Reminded patient regarding Pap smear.  Fasting glucose is 112. Hemoglobin A1c 5.6%

## 2013-10-07 NOTE — Patient Instructions (Signed)
Continue same medications and return in one year. 

## 2013-11-09 ENCOUNTER — Encounter: Payer: Self-pay | Admitting: Internal Medicine

## 2013-11-16 ENCOUNTER — Encounter: Payer: Self-pay | Admitting: Internal Medicine

## 2013-12-28 ENCOUNTER — Other Ambulatory Visit: Payer: Self-pay | Admitting: Internal Medicine

## 2014-09-15 ENCOUNTER — Other Ambulatory Visit: Payer: Self-pay | Admitting: *Deleted

## 2014-09-15 NOTE — Telephone Encounter (Signed)
Patient needs office visit prior to anymore refills on medications

## 2014-09-29 ENCOUNTER — Other Ambulatory Visit: Payer: Self-pay | Admitting: Internal Medicine

## 2014-09-30 ENCOUNTER — Encounter: Payer: Self-pay | Admitting: Internal Medicine

## 2014-09-30 ENCOUNTER — Other Ambulatory Visit: Payer: Self-pay | Admitting: Internal Medicine

## 2014-10-07 ENCOUNTER — Other Ambulatory Visit: Payer: 59 | Admitting: Internal Medicine

## 2014-10-07 ENCOUNTER — Other Ambulatory Visit: Payer: Self-pay | Admitting: Internal Medicine

## 2014-10-07 DIAGNOSIS — Z13220 Encounter for screening for lipoid disorders: Secondary | ICD-10-CM

## 2014-10-07 DIAGNOSIS — Z1329 Encounter for screening for other suspected endocrine disorder: Secondary | ICD-10-CM

## 2014-10-07 DIAGNOSIS — Z0000 Encounter for general adult medical examination without abnormal findings: Secondary | ICD-10-CM

## 2014-10-07 DIAGNOSIS — Z130 Encounter for screening for diseases of the blood and blood-forming organs and certain disorders involving the immune mechanism: Secondary | ICD-10-CM

## 2014-10-07 DIAGNOSIS — Z1321 Encounter for screening for nutritional disorder: Secondary | ICD-10-CM

## 2014-10-07 LAB — COMPLETE METABOLIC PANEL WITH GFR
ALBUMIN: 4.5 g/dL (ref 3.6–5.1)
ALK PHOS: 41 U/L (ref 33–130)
ALT: 22 U/L (ref 6–29)
AST: 18 U/L (ref 10–35)
BILIRUBIN TOTAL: 0.7 mg/dL (ref 0.2–1.2)
BUN: 24 mg/dL (ref 7–25)
CALCIUM: 9.5 mg/dL (ref 8.6–10.4)
CO2: 25 mmol/L (ref 20–31)
CREATININE: 0.73 mg/dL (ref 0.50–0.99)
Chloride: 101 mmol/L (ref 98–110)
GFR, EST NON AFRICAN AMERICAN: 88 mL/min (ref >=60)
Glucose, Bld: 109 mg/dL — ABNORMAL HIGH (ref 65–99)
Potassium: 3.9 mmol/L (ref 3.5–5.3)
Sodium: 136 mmol/L (ref 135–146)
TOTAL PROTEIN: 7 g/dL (ref 6.1–8.1)

## 2014-10-07 LAB — CBC WITH DIFFERENTIAL/PLATELET
Basophils Absolute: 0 10*3/uL (ref 0.0–0.1)
Basophils Relative: 1 % (ref 0–1)
EOS ABS: 0.2 10*3/uL (ref 0.0–0.7)
Eosinophils Relative: 4 % (ref 0–5)
HEMATOCRIT: 40.1 % (ref 36.0–46.0)
Hemoglobin: 13.9 g/dL (ref 12.0–15.0)
LYMPHS ABS: 1.7 10*3/uL (ref 0.7–4.0)
LYMPHS PCT: 37 % (ref 12–46)
MCH: 31.1 pg (ref 26.0–34.0)
MCHC: 34.7 g/dL (ref 30.0–36.0)
MCV: 89.7 fL (ref 78.0–100.0)
MONOS PCT: 7 % (ref 3–12)
MPV: 9.4 fL (ref 8.6–12.4)
Monocytes Absolute: 0.3 10*3/uL (ref 0.1–1.0)
NEUTROS PCT: 51 % (ref 43–77)
Neutro Abs: 2.3 10*3/uL (ref 1.7–7.7)
PLATELETS: 228 10*3/uL (ref 150–400)
RBC: 4.47 MIL/uL (ref 3.87–5.11)
RDW: 13.6 % (ref 11.5–15.5)
WBC: 4.6 10*3/uL (ref 4.0–10.5)

## 2014-10-07 LAB — TSH: TSH: 2.291 u[IU]/mL (ref 0.350–4.500)

## 2014-10-07 LAB — LIPID PANEL
CHOL/HDL RATIO: 3.3 ratio (ref <=5.0)
Cholesterol: 202 mg/dL — ABNORMAL HIGH (ref 125–200)
HDL: 62 mg/dL (ref >=46)
LDL CALC: 118 mg/dL (ref <130)
TRIGLYCERIDES: 111 mg/dL (ref <150)
VLDL: 22 mg/dL (ref <30)

## 2014-10-08 LAB — VITAMIN D 25 HYDROXY (VIT D DEFICIENCY, FRACTURES): Vit D, 25-Hydroxy: 53 ng/mL (ref 30–100)

## 2014-10-11 ENCOUNTER — Ambulatory Visit (INDEPENDENT_AMBULATORY_CARE_PROVIDER_SITE_OTHER): Payer: 59 | Admitting: Internal Medicine

## 2014-10-11 ENCOUNTER — Encounter: Payer: Self-pay | Admitting: Internal Medicine

## 2014-10-11 VITALS — BP 120/84 | HR 80 | Temp 98.1°F | Ht 62.0 in | Wt 161.0 lb

## 2014-10-11 DIAGNOSIS — R011 Cardiac murmur, unspecified: Secondary | ICD-10-CM

## 2014-10-11 DIAGNOSIS — E785 Hyperlipidemia, unspecified: Secondary | ICD-10-CM

## 2014-10-11 DIAGNOSIS — Z23 Encounter for immunization: Secondary | ICD-10-CM

## 2014-10-11 DIAGNOSIS — R739 Hyperglycemia, unspecified: Secondary | ICD-10-CM

## 2014-10-11 DIAGNOSIS — J309 Allergic rhinitis, unspecified: Secondary | ICD-10-CM

## 2014-10-11 DIAGNOSIS — I10 Essential (primary) hypertension: Secondary | ICD-10-CM | POA: Diagnosis not present

## 2014-10-11 DIAGNOSIS — B009 Herpesviral infection, unspecified: Secondary | ICD-10-CM | POA: Diagnosis not present

## 2014-10-11 DIAGNOSIS — Z0000 Encounter for general adult medical examination without abnormal findings: Secondary | ICD-10-CM | POA: Diagnosis not present

## 2014-10-11 LAB — POCT URINALYSIS DIPSTICK
Bilirubin, UA: NEGATIVE
Blood, UA: NEGATIVE
Glucose, UA: NEGATIVE
Ketones, UA: NEGATIVE
LEUKOCYTES UA: NEGATIVE
NITRITE UA: NEGATIVE
PROTEIN UA: NEGATIVE
Spec Grav, UA: 1.01
UROBILINOGEN UA: NEGATIVE
pH, UA: 6

## 2014-10-11 NOTE — Patient Instructions (Addendum)
It was pleasure to see you today. Continue same medications and return in one year. Please contact gastroenterologist regarding colonoscopy. Submit 3 Hemoccult cards. Have mammogram. Watch diet and exercise. Flu vaccine given.

## 2014-10-11 NOTE — Progress Notes (Signed)
Subjective:    Patient ID: Sally Hopkins, female    DOB: 02/09/1950, 64 y.o.   MRN: [id number]  HPI  64 year old White Female for health maintenance and evaluation of medical issues. History of hyperlipidemia, essential hypertension, allergic rhinitis, herpes simplex type II. She takes potassium supplement due to hypokalemia from diuretic therapy.  Patient had colonoscopy by Dr. Olevia Perches in 2004. Has not had follow-up. Has had some insurance issues and says she will look into it in the near future. 3 Hemoccult cards given.  She has a history of Mnire's disease. Says it is stable and in remission.  In 2006, she had cryosurgery of the cervix.  Social history: She is married with 2 adult children. Does not smoke. Drinks 8 ounces of wine daily. Graduate of [employer]. 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  Family history: Father died at 43 of ALS but also had history of colon cancer. Mother with history of open heart surgery 1969 for congenital heart disease and also had coronary artery bypass surgery. One sister in good health.  Patient has thimerosal allergy which is a preservative in vaccines.  Sees GYN for Pap smears.  History of mildly elevated serum glucose. Hemoglobin A1c has been followed yearly. Highest was 2 years ago at 5.8%  Flu vaccine given today-nurse indicated that single dose vials do not contain thimerosal.       Review of Systems  Constitutional: Negative.   All other systems reviewed and are negative.      Objective:   Physical Exam  Constitutional: She is oriented to person, place, and time. She appears well-developed and well-nourished. No distress.  HENT:  Head: Normocephalic and atraumatic.  Right Ear: External ear normal.  Left Ear: External ear normal.  Mouth/Throat: Oropharynx is clear and moist. No oropharyngeal exudate.  Eyes: Conjunctivae and EOM are normal. Pupils are equal, round, and reactive to light. Right eye  exhibits no discharge. Left eye exhibits no discharge. No scleral icterus.  Neck: Normal range of motion. Neck supple. No JVD present. No thyromegaly present.  Cardiovascular: Normal rate and regular rhythm.   Murmur heard. 2/6 systolic ejection murmur unchanged  Pulmonary/Chest: Effort normal and breath sounds normal. No respiratory distress. She has no wheezes. She has no rales. She exhibits no tenderness.  Abdominal: Soft. Bowel sounds are normal. She exhibits no distension and no mass. There is no tenderness. There is no rebound and no guarding.  Genitourinary:  Deferred to GYN  Musculoskeletal: She exhibits no edema.  Lymphadenopathy:    She has no cervical adenopathy.  Neurological: She is alert and oriented to person, place, and time. She has normal reflexes. No cranial nerve deficit. Coordination normal.  Skin: Skin is warm and dry. No rash noted. She is not diaphoretic.  Psychiatric: She has a normal mood and affect. Her behavior is normal. Judgment and thought content normal.  Vitals reviewed.         Assessment & Plan:  Essential hypertension-stable on current regimen  Hyperlipidemia-treated with statin therapy. Needs to watch diet.  Benign systolic ejection murmur-unchanged and asymptomatic  History of Mnire's disease-stable  Allergic rhinitis  Herpes simplex type II-treated with Valtrex  Elevated serum glucose-hemoglobin A1c added  History of hypokalemia on diuretic therapy-potassium normal  Plan: Once insurance issues are cleared up she needs to proceed with colonoscopy because of family history and last exam was 2004. 3 Hemoccult cards given today. Also needs mammogram and bone density study. Okay  to refill medications for one year. Return in one year or as needed. Hemoglobin A1c pending. Flu vaccine given.

## 2014-10-12 ENCOUNTER — Telehealth: Payer: Self-pay | Admitting: *Deleted

## 2014-10-12 LAB — HEMOGLOBIN A1C
HEMOGLOBIN A1C: 5.7 % — AB (ref <5.7)
Mean Plasma Glucose: 117 mg/dL — ABNORMAL HIGH (ref <117)

## 2014-10-12 NOTE — Telephone Encounter (Signed)
Left message with patient lab results and instructions on patient voice mail

## 2015-01-05 ENCOUNTER — Other Ambulatory Visit: Payer: Self-pay

## 2015-01-05 ENCOUNTER — Other Ambulatory Visit: Payer: Self-pay | Admitting: Internal Medicine

## 2015-01-05 MED ORDER — VALACYCLOVIR HCL 500 MG PO TABS: 500.0000 mg | ORAL_TABLET | Freq: Two times a day (BID) | ORAL | Status: DC

## 2015-04-04 ENCOUNTER — Encounter: Payer: Self-pay | Admitting: Internal Medicine

## 2015-04-04 ENCOUNTER — Ambulatory Visit (INDEPENDENT_AMBULATORY_CARE_PROVIDER_SITE_OTHER): Payer: BLUE CROSS/BLUE SHIELD | Admitting: Internal Medicine

## 2015-04-04 VITALS — BP 128/64 | HR 84 | Temp 99.6°F | Resp 18 | Wt 157.0 lb

## 2015-04-04 DIAGNOSIS — J0100 Acute maxillary sinusitis, unspecified: Secondary | ICD-10-CM

## 2015-04-04 DIAGNOSIS — J029 Acute pharyngitis, unspecified: Secondary | ICD-10-CM | POA: Diagnosis not present

## 2015-04-04 LAB — POCT RAPID STREP A (OFFICE): Rapid Strep A Screen: NEGATIVE

## 2015-04-04 MED ORDER — HYDROCODONE-HOMATROPINE 5-1.5 MG/5ML PO SYRP: 5.0000 mL | ORAL_SOLUTION | Freq: Four times a day (QID) | ORAL | Status: DC | PRN

## 2015-04-04 MED ORDER — LEVOFLOXACIN 500 MG PO TABS: 500.0000 mg | ORAL_TABLET | Freq: Every day | ORAL | Status: DC

## 2015-04-04 NOTE — Patient Instructions (Addendum)
Rest and drink plenty of fluids. Levaquin 500 milligrams daily for 10 days. Hycodan 1 teaspoon by mouth every 8 hours when necessary cough, headache.

## 2015-04-04 NOTE — Progress Notes (Signed)
   Subjective:    Patient ID: Sally Hopkins, female    DOB: 02/04/1950, 65 y.o.   MRN: [id number]  HPI Onset 2 days ago sneezing and sore throat. Has been taking over-the-counter medication without success. Has upcoming anniversary and beach trip planned. Thinks that she has acute sinusitis. Has some maxillary sinus pressure and headache. Not much nasal drainage. No significant coughing. Has low-grade fever. Has malaise and fatigue.    Review of Systems     Objective:   Physical Exam Skin warm and dry. Pharynx slightly injected. Rapid strep screen negative. TMs are slightly full bilaterally but not red. Neck is supple without adenopathy. Chest clear to auscultation without rales or wheezing.       Assessment & Plan:  Acute sinusitis  Plan: Depo-Medrol IM. Levaquin 500 mg daily for 10 days. Hycodan 1 teaspoon by mouth every 8 hours when necessary headache, sore throat pain or cough. Rest and drink plenty of fluids. Tylenol as needed for fever.

## 2015-09-07 ENCOUNTER — Encounter: Payer: Self-pay | Admitting: Internal Medicine

## 2015-09-07 ENCOUNTER — Ambulatory Visit (INDEPENDENT_AMBULATORY_CARE_PROVIDER_SITE_OTHER): Payer: BLUE CROSS/BLUE SHIELD | Admitting: Internal Medicine

## 2015-09-07 VITALS — BP 138/84 | HR 77 | Temp 99.1°F | Ht 62.0 in | Wt 158.5 lb

## 2015-09-07 DIAGNOSIS — R319 Hematuria, unspecified: Secondary | ICD-10-CM

## 2015-09-07 DIAGNOSIS — Z9889 Other specified postprocedural states: Secondary | ICD-10-CM | POA: Diagnosis not present

## 2015-09-07 DIAGNOSIS — R8290 Unspecified abnormal findings in urine: Secondary | ICD-10-CM | POA: Diagnosis not present

## 2015-09-07 DIAGNOSIS — Z8742 Personal history of other diseases of the female genital tract: Secondary | ICD-10-CM | POA: Diagnosis not present

## 2015-09-07 LAB — POCT URINALYSIS DIPSTICK
Bilirubin, UA: NEGATIVE
Glucose, UA: NEGATIVE
Ketones, UA: NEGATIVE
NITRITE UA: NEGATIVE
PH UA: 5
Protein, UA: NEGATIVE
RBC UA: NEGATIVE
SPEC GRAV UA: 1.025
UROBILINOGEN UA: 0.2

## 2015-09-07 NOTE — Progress Notes (Addendum)
   Subjective:    Patient ID: Sally Hopkins, female    DOB: April 12, 1950, 65 y.o.   MRN: [id number]  HPI 65 year old Female who says she had colposcopy by GYN physician for abnormal Pap smear due to HPV in Spring of 2017. GYN physician is Dr. Paula Compton. She also had cryosurgery of the cervix in 2006. She did have a negative Pap 2015 at [facility] by Dr. Marvel Plan. Patient says for several days now at least a week she has noticed a drop of blood in her underpants in an area she thinks is consistent with her urethra. Otherwise no active bleeding for the rest of the day. In no douche area. No fever or shaking chills. No significant back pain. She thinks she may have a urinary infection. She purchased an over-the-counter kit to check for UTI and said it was positive for leukocyte esterase.  Her urine specimen today is positive for leukocyte esterase but negative for blood. Culture will be sent.  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 Had Pap smear September 2015 and endocervical curettage May 2017 showing benign transitional mucosa with no evidence of malignancy. Had colposcopy 12/23/2014. Notes indicate colposcopy was not optimal with stenosis of the cervix present.  Review of Systems as above     Objective:   Physical Exam  Urine dipstick is abnormal with LE present. No occult blood present. Culture sent.  Inspection of vagina shows friability of vagina close to cervix. When plastic speculum was inserted, area close to cervix bled easily.      Assessment & Plan:  History of abnormal Pap smear status post colposcopy  by GYN  Friable vaginal mucosa-likely the source of vaginal bleeding rather than UTI  Abnormal urinalysis-culture pending  Plan. Have asked patient to recontact OB/GYN regarding evaluation of possible vaginal bleeding. This may well be related to vaginal atrophy but she has history of abnormal Pap smear and is  status post colposcopy. Have not treated her with antibiotics today pending urine culture.

## 2015-09-07 NOTE — Patient Instructions (Signed)
Urine culture pending. Please contact GYN physician regarding evaluation of vaginal atrophy/bleeding.

## 2015-09-08 LAB — URINE CULTURE

## 2015-09-12 ENCOUNTER — Telehealth: Payer: Self-pay

## 2015-09-12 NOTE — Telephone Encounter (Signed)
Called patient. Gave lab results. Patient verbalized understanding.  

## 2015-09-12 NOTE — Telephone Encounter (Signed)
-----   Message from Elby Showers, MD sent at 09/09/2015 11:22 AM EDT ----- Insignificant growth

## 2015-09-27 ENCOUNTER — Other Ambulatory Visit: Payer: Self-pay | Admitting: Internal Medicine

## 2015-10-25 ENCOUNTER — Other Ambulatory Visit: Payer: Self-pay | Admitting: Internal Medicine

## 2015-10-27 ENCOUNTER — Other Ambulatory Visit: Payer: BLUE CROSS/BLUE SHIELD | Admitting: Internal Medicine

## 2015-10-27 ENCOUNTER — Other Ambulatory Visit: Payer: Self-pay | Admitting: Internal Medicine

## 2015-10-27 DIAGNOSIS — Z0000 Encounter for general adult medical examination without abnormal findings: Secondary | ICD-10-CM

## 2015-10-27 DIAGNOSIS — E785 Hyperlipidemia, unspecified: Secondary | ICD-10-CM

## 2015-10-27 DIAGNOSIS — I10 Essential (primary) hypertension: Secondary | ICD-10-CM

## 2015-10-27 LAB — LIPID PANEL
CHOL/HDL RATIO: 3.3 ratio (ref <=5.0)
CHOLESTEROL: 222 mg/dL — AB (ref 125–200)
HDL: 67 mg/dL (ref >=46)
LDL Cholesterol: 127 mg/dL (ref <130)
Triglycerides: 141 mg/dL (ref <150)
VLDL: 28 mg/dL (ref <30)

## 2015-10-27 LAB — COMPREHENSIVE METABOLIC PANEL
ALK PHOS: 38 U/L (ref 33–130)
ALT: 20 U/L (ref 6–29)
AST: 19 U/L (ref 10–35)
Albumin: 4.2 g/dL (ref 3.6–5.1)
BUN: 27 mg/dL — ABNORMAL HIGH (ref 7–25)
CALCIUM: 9.5 mg/dL (ref 8.6–10.4)
CHLORIDE: 103 mmol/L (ref 98–110)
CO2: 23 mmol/L (ref 20–31)
Creat: 0.97 mg/dL (ref 0.50–0.99)
Glucose, Bld: 116 mg/dL — ABNORMAL HIGH (ref 65–99)
POTASSIUM: 4.3 mmol/L (ref 3.5–5.3)
Sodium: 138 mmol/L (ref 135–146)
TOTAL PROTEIN: 6.9 g/dL (ref 6.1–8.1)
Total Bilirubin: 0.7 mg/dL (ref 0.2–1.2)

## 2015-10-27 LAB — CBC WITH DIFFERENTIAL/PLATELET
BASOS ABS: 44 {cells}/uL (ref 0–200)
BASOS PCT: 1 %
EOS ABS: 132 {cells}/uL (ref 15–500)
Eosinophils Relative: 3 %
HEMATOCRIT: 40.2 % (ref 35.0–45.0)
Hemoglobin: 13.8 g/dL (ref 11.7–15.5)
LYMPHS PCT: 34 %
Lymphs Abs: 1496 cells/uL (ref 850–3900)
MCH: 31.7 pg (ref 27.0–33.0)
MCHC: 34.3 g/dL (ref 32.0–36.0)
MCV: 92.2 fL (ref 80.0–100.0)
MONO ABS: 484 {cells}/uL (ref 200–950)
MONOS PCT: 11 %
MPV: 10.2 fL (ref 7.5–12.5)
Neutro Abs: 2244 cells/uL (ref 1500–7800)
Neutrophils Relative %: 51 %
Platelets: 221 10*3/uL (ref 140–400)
RBC: 4.36 MIL/uL (ref 3.80–5.10)
RDW: 12.9 % (ref 11.0–15.0)
WBC: 4.4 10*3/uL (ref 3.8–10.8)

## 2015-10-27 LAB — TSH: TSH: 4.6 mIU/L — ABNORMAL HIGH

## 2015-10-28 LAB — VITAMIN D 25 HYDROXY (VIT D DEFICIENCY, FRACTURES): Vit D, 25-Hydroxy: 40 ng/mL (ref 30–100)

## 2015-10-30 ENCOUNTER — Ambulatory Visit (INDEPENDENT_AMBULATORY_CARE_PROVIDER_SITE_OTHER): Payer: BLUE CROSS/BLUE SHIELD | Admitting: Internal Medicine

## 2015-10-30 ENCOUNTER — Telehealth: Payer: Self-pay | Admitting: *Deleted

## 2015-10-30 VITALS — BP 130/82 | HR 80 | Temp 98.1°F | Ht 62.0 in | Wt 160.0 lb

## 2015-10-30 DIAGNOSIS — Z0000 Encounter for general adult medical examination without abnormal findings: Secondary | ICD-10-CM | POA: Diagnosis not present

## 2015-10-30 DIAGNOSIS — Z23 Encounter for immunization: Secondary | ICD-10-CM

## 2015-10-30 DIAGNOSIS — R946 Abnormal results of thyroid function studies: Secondary | ICD-10-CM

## 2015-10-30 DIAGNOSIS — R739 Hyperglycemia, unspecified: Secondary | ICD-10-CM

## 2015-10-30 DIAGNOSIS — R011 Cardiac murmur, unspecified: Secondary | ICD-10-CM | POA: Diagnosis not present

## 2015-10-30 DIAGNOSIS — B009 Herpesviral infection, unspecified: Secondary | ICD-10-CM | POA: Diagnosis not present

## 2015-10-30 DIAGNOSIS — I10 Essential (primary) hypertension: Secondary | ICD-10-CM

## 2015-10-30 DIAGNOSIS — R7989 Other specified abnormal findings of blood chemistry: Secondary | ICD-10-CM

## 2015-10-30 LAB — POCT URINALYSIS DIPSTICK
Bilirubin, UA: NEGATIVE
GLUCOSE UA: NEGATIVE
Ketones, UA: NEGATIVE
Leukocytes, UA: NEGATIVE
NITRITE UA: NEGATIVE
PROTEIN UA: NEGATIVE
RBC UA: NEGATIVE
Spec Grav, UA: 1.015
UROBILINOGEN UA: NEGATIVE
pH, UA: 5

## 2015-10-30 NOTE — Patient Instructions (Signed)
It was a pleasure to see you today. Continue same medications and return in 6 weeks for TSH to be repeated. Flu vaccine given today.

## 2015-10-30 NOTE — Telephone Encounter (Signed)
A1c added to 10/27/15 blood work.

## 2015-10-30 NOTE — Progress Notes (Signed)
   Subjective:    Patient ID: Sally Hopkins, female    DOB: [birth year], 65 y.o.   MRN: [id number]  HPI 65 year old Female in today for health maintenance exam and evaluation of medical issues.She has a history of hyperlipidemia, essential hypertension, allergic rhinitis, herpes simplex type II. She takes potassium supplement due to hypokalemia from diuretic therapy.  Patient had colonoscopy by Dr. Olevia Perches in 2004. Has not had follow-up.  History of Mnire's disease which she says is stable and in remission.  She has had cryosurgery of the cervix in 2006 and history of abnormal Pap smears followed by GYN.  Social history: She is married with 2 adult children does not smoke. Drinks 8 ounces of wine daily. Graduate of [employer]. 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  Family history: Father died at 90 of a low less but also had colon cancer. Mother with history of open heart surgery 1969 for congenital heart disease and also had coronary artery bypass surgery. One sister in good health.  Patient has Thieme aerosol allergy which used to be used as a preservative in vaccines. This is not in the flu vaccine and she was given flu vaccine today. Single-dose vials do not contain thimerosal.  History of mildly elevated serum glucose. Hemoglobin A1c has been followed annually. Highest A1c was 5.8% 3 years ago.      Review of Systems no new complaints     Objective:   Physical Exam  Constitutional: She is oriented to person, place, and time. She appears well-developed and well-nourished. No distress.  HENT:  Head: Normocephalic and atraumatic.  Right Ear: External ear normal.  Left Ear: External ear normal.  Mouth/Throat: Oropharynx is clear and moist.  Eyes: Conjunctivae and EOM are normal. Pupils are equal, round, and reactive to light. Right eye exhibits no discharge. Left eye exhibits no discharge. No scleral icterus.  Neck: Neck supple. No JVD present. No tracheal  deviation present. No thyromegaly present.  Cardiovascular: Normal rate, regular rhythm and intact distal pulses.   Murmur heard. Pulmonary/Chest: Effort normal and breath sounds normal. She has no wheezes.  Abdominal: Soft. Bowel sounds are normal. She exhibits no distension and no mass. There is no tenderness. There is no rebound and no guarding.  Genitourinary:  Genitourinary Comments: Deferred to GYN  Musculoskeletal: She exhibits no edema.  Lymphadenopathy:    She has no cervical adenopathy.  Neurological: She is alert and oriented to person, place, and time. She has normal reflexes. No cranial nerve deficit. Coordination normal.  Skin: Skin is warm and dry. No rash noted. She is not diaphoretic.  Psychiatric: She has a normal mood and affect. Her behavior is normal. Judgment and thought content normal.  Vitals reviewed.         Assessment & Plan:  Normal health maintenance exam  Essential hypertension-stable on current regimen  Hyperlipidemia-total cholesterol 222, triglycerides 141, HDL cholesterol 67, LDL cholesterol 127.  History of abnormal Pap smear.  History of herpes simplex type II  History of Mnire's disease-stable  Benign systolic ejection murmur which is stable and unchanged  History of elevated serum glucose-glucose is 116 and hemoglobin A1c 5.3%  Hypokalemia due to diuretic therapy-potassium stable at 4.3  Elevated TSH-needs repeat in a few weeks  Plan: Return in one year or as needed. Flu vaccine given.

## 2015-10-31 LAB — HEMOGLOBIN A1C
HEMOGLOBIN A1C: 5.3 % (ref <5.7)
Mean Plasma Glucose: 105 mg/dL

## 2015-11-06 ENCOUNTER — Encounter: Payer: Self-pay | Admitting: Internal Medicine

## 2015-11-24 ENCOUNTER — Other Ambulatory Visit: Payer: Self-pay | Admitting: Internal Medicine

## 2015-12-11 ENCOUNTER — Other Ambulatory Visit: Payer: BLUE CROSS/BLUE SHIELD | Admitting: Internal Medicine

## 2015-12-11 DIAGNOSIS — Z1329 Encounter for screening for other suspected endocrine disorder: Secondary | ICD-10-CM

## 2015-12-11 LAB — TSH: TSH: 2.64 mIU/L

## 2015-12-12 ENCOUNTER — Telehealth: Payer: Self-pay | Admitting: Internal Medicine

## 2015-12-12 NOTE — Telephone Encounter (Signed)
Left message informing patient of normal TSH and appointment information.

## 2015-12-12 NOTE — Telephone Encounter (Signed)
-----   Message from Elby Showers, MD sent at 12/12/2015 10:46 AM EST ----- TSH is normal. Should recheck in 6 months with OV

## 2015-12-26 ENCOUNTER — Other Ambulatory Visit: Payer: Self-pay | Admitting: Internal Medicine

## 2015-12-26 NOTE — Telephone Encounter (Signed)
Refill all through October 2018

## 2016-01-08 HISTORY — PX: OTHER SURGICAL HISTORY: SHX169

## 2016-01-29 ENCOUNTER — Telehealth: Payer: Self-pay | Admitting: Family Medicine

## 2016-01-29 MED ORDER — OSELTAMIVIR PHOSPHATE 75 MG PO CAPS: 75.0000 mg | ORAL_CAPSULE | Freq: Every day | ORAL | 0 refills | Status: AC

## 2016-01-29 NOTE — Telephone Encounter (Signed)
Prophylactic treatment for flu since her husband has the flu

## 2016-02-25 ENCOUNTER — Other Ambulatory Visit: Payer: Self-pay | Admitting: Internal Medicine

## 2016-04-26 ENCOUNTER — Telehealth: Payer: Self-pay | Admitting: Internal Medicine

## 2016-04-26 NOTE — Telephone Encounter (Signed)
Pt was notified and verbalized understanding.

## 2016-04-26 NOTE — Telephone Encounter (Signed)
She needs to see her Dermatologist

## 2016-04-26 NOTE — Telephone Encounter (Signed)
Pt is calling in stating that she has a horrible rash that started on her thighs as small spots about a month ago but is now it has spread across her body except her face.  The rash has gotten much worse over the past week.  The rash is raised and splotchy. She states it looks to her like sun poisoning but she has not been in the sun.  She is out of town and has tried different types of lotions that is not helping the itching.   It is affecting her sleep.  No fever.  She states she has a family hx of allergic reactions but has not personally had this type of reaction.  She does take Zyrtec everyday but is wondering if she can day Benadryl as well?  Can you advise on what pt should do?

## 2016-05-03 ENCOUNTER — Other Ambulatory Visit: Payer: Self-pay | Admitting: Internal Medicine

## 2016-06-07 ENCOUNTER — Other Ambulatory Visit: Payer: BLUE CROSS/BLUE SHIELD | Admitting: Internal Medicine

## 2016-06-07 DIAGNOSIS — R7989 Other specified abnormal findings of blood chemistry: Secondary | ICD-10-CM

## 2016-06-08 LAB — TSH: TSH: 2.53 mIU/L

## 2016-06-10 ENCOUNTER — Encounter: Payer: Self-pay | Admitting: Internal Medicine

## 2016-06-10 ENCOUNTER — Ambulatory Visit (INDEPENDENT_AMBULATORY_CARE_PROVIDER_SITE_OTHER): Payer: BLUE CROSS/BLUE SHIELD | Admitting: Internal Medicine

## 2016-06-10 VITALS — BP 122/70 | HR 75 | Temp 98.7°F | Wt 155.0 lb

## 2016-06-10 DIAGNOSIS — L299 Pruritus, unspecified: Secondary | ICD-10-CM

## 2016-06-10 DIAGNOSIS — F439 Reaction to severe stress, unspecified: Secondary | ICD-10-CM

## 2016-06-10 DIAGNOSIS — I10 Essential (primary) hypertension: Secondary | ICD-10-CM

## 2016-06-10 DIAGNOSIS — R7989 Other specified abnormal findings of blood chemistry: Secondary | ICD-10-CM

## 2016-06-10 DIAGNOSIS — R946 Abnormal results of thyroid function studies: Secondary | ICD-10-CM | POA: Diagnosis not present

## 2016-06-10 MED ORDER — ALPRAZOLAM 0.25 MG PO TABS: 0.2500 mg | ORAL_TABLET | Freq: Three times a day (TID) | ORAL | 0 refills | Status: DC | PRN

## 2016-06-10 MED ORDER — HYDROXYZINE HCL 25 MG PO TABS: 25.0000 mg | ORAL_TABLET | Freq: Three times a day (TID) | ORAL | 0 refills | Status: DC

## 2016-06-10 NOTE — Patient Instructions (Addendum)
TSH is now normal. Continue to monitor. Next appointment is physical exam in October. Continue same antihypertensive medications. Prescribed Atarax for itching in addition to Zantac 150 mg twice daily and Zyrtec or Claritin. Appointment with allergist. Refilled Xanax for anxiety. Continue diet and exercise.

## 2016-06-10 NOTE — Progress Notes (Signed)
   Subjective:    Patient ID: Sally Hopkins, female    DOB: 1950/08/31, 66 y.o.   MRN: [id number]  HPI  Pt called to complained about itching on April 20th. [place] and was given Depomedrol and Xanax.No hives just redness and itching. Symptoms started after furniture market. Has been under some stress. A good friend is being treated for esophageal cancer. Furniture market was stressful.  She is reluctant to take Xanax because she likes to have a glass of wine nightly. She's not sure if she is allergic to anything in the wine.  Says father had similar issues.  Says she was told years ago she is allergic to thimerosal but has not been allergy tested in some time.  Explained to her she could takes Zantac 150 twice daily. She did not realize that was a histamine blocker. I have given her prescription for Atarax for itching. Explained to her she may take Zyrtec or Claritin on a daily basis which she says she already does. Explained that we could add doxepin but I would be uncomfortable with her drinking wine and taking that medication. I think she should see allergist.  With regard to Elevated TSH, TSH is now normal. In October TSH was 4.60 and previously had been 2.291. She does not carry a diagnosis of hypothyroidism. She will be going on Medicare in July. She'll be due for  welcome to Medicare physical exam in October.  She does have a history of hyperlipidemia and hypertension. Blood pressures 122/70 on current regimen. Weight is 155 pounds.    Review of Systems no urticaria, no angioedema     Objective:   Physical Exam Erythema on legs, arms and back. No thyromegaly.       Assessment & Plan:  Hypothyroidism-continue same dose of thyroid replacement  Generalized itching  Anxiety and situational stress  Plan: Zantac 150 mg twice daily. Claritin or Zyrtec daily. When necessary Atarax. Have refilled Xanax to take for anxiety.  Spent 25 minutes speaking  with patient about these issues

## 2016-07-05 ENCOUNTER — Telehealth: Payer: Self-pay | Admitting: Internal Medicine

## 2016-07-05 DIAGNOSIS — R21 Rash and other nonspecific skin eruption: Secondary | ICD-10-CM

## 2016-07-05 MED ORDER — TRIAMCINOLONE ACETONIDE 0.1 % EX CREA: 1.0000 "application " | TOPICAL_CREAM | Freq: Three times a day (TID) | CUTANEOUS | 1 refills | Status: DC

## 2016-07-05 NOTE — Telephone Encounter (Signed)
Call in Triamcinolone cream 0.1% to rash tid -30 grams with one refill

## 2016-07-05 NOTE — Telephone Encounter (Signed)
States that she has another break out of the terrible rash.  She HAS an appointment with the allergist but not until mid July.  Her husband went into A-Fib and had to have open heart surgery and is currently in ICU at [facility].  So, she's convinced that this is stress related.  She says that the Xanax and Atarax works [employer], but she needs to stay on her toes and she cannot take those right now and be present like she needs to be for him.    So, she wants to know if you will call in some Triamcinolone Cream for her to put on this rash to help calm it down in the meantime while she's needing to be with her husband.    Pharmacy:  [facility] @ [place] # for contact:  [phone]  Thank you.

## 2016-07-24 ENCOUNTER — Encounter: Payer: Self-pay | Admitting: Allergy and Immunology

## 2016-07-24 ENCOUNTER — Ambulatory Visit (INDEPENDENT_AMBULATORY_CARE_PROVIDER_SITE_OTHER): Payer: PPO | Admitting: Allergy and Immunology

## 2016-07-24 VITALS — BP 122/72 | HR 76 | Temp 97.5°F | Resp 18 | Ht 62.0 in | Wt 152.2 lb

## 2016-07-24 DIAGNOSIS — J3089 Other allergic rhinitis: Secondary | ICD-10-CM | POA: Diagnosis not present

## 2016-07-24 DIAGNOSIS — L500 Allergic urticaria: Secondary | ICD-10-CM

## 2016-07-24 MED ORDER — MONTELUKAST SODIUM 10 MG PO TABS: 10.0000 mg | ORAL_TABLET | Freq: Every day | ORAL | 5 refills | Status: DC

## 2016-07-24 MED ORDER — RANITIDINE HCL 150 MG PO CAPS: 150.0000 mg | ORAL_CAPSULE | Freq: Two times a day (BID) | ORAL | 5 refills | Status: DC

## 2016-07-24 MED ORDER — TRIAMCINOLONE ACETONIDE 0.1 % EX CREA: 1.0000 "application " | TOPICAL_CREAM | Freq: Three times a day (TID) | CUTANEOUS | 1 refills | Status: DC

## 2016-07-24 NOTE — Progress Notes (Signed)
Dear Renold Genta,  Thank you for referring Addley Ballinger Acuna to the [facility] on 07/24/2016.   Below is a summation of this patient's evaluation and recommendations.  Thank you for your referral. I will keep you informed about this patient's response to treatment.   If you have any questions please do not hesitate to contact me.   Sincerely,  Jiles Prows, MD Allergy / [place]   ______________________________________________________________________    NEW PATIENT NOTE  Referring Provider: Elby Showers, MD Primary Provider: Elby Showers, MD Date of office visit: 07/24/2016    Subjective:   Chief Complaint:  Sally Hopkins (DOB: 08-Jan-1951) is a 66 y.o. female who presents to the clinic on 07/24/2016 with a chief complaint of Pruritus and Urticaria .     HPI: Melita presents to this clinic in evaluation of pruritus and hives that have developed over the course of the past 7 months.  Apparently she develops recurrent and intermittent episodes of red raised and flat areas associated with significant pruritus and occasionally has pruritus unrelated to an obvious visible dermatitis. Her lesions never heal with scar or hyperpigmentation. There is no associated systemic or constitutional symptoms. There is no obvious trigger. There has not been an obvious environmental change or medication change that may account for this issue. She did require urgent care evaluation and treatment in April 2018 and received a systemic steroid which did result in resolution of this issue for a few weeks. Recently she saw Dr. Renold Genta in June who treated her with Atarax which has helped her somewhat. Stress may be an issue and she was given Xanax which may have helped but yet still continues to have significant problems even in the face of utilizing Atarax and Xanax.   She does have a history of  sneezing and nasal congestion for which she takes Zyrtec and Sudafed but still remains symptomatic in regard to this issue. This appears to be a perennial issue without a significant seasonal variation. She does use Zyrtec every day for this issue.  Past Medical History:  Diagnosis Date  . HSV-1 infection   . Hypertension   . Meniere's disease     Past Surgical History:  Procedure Laterality Date  . cervical cryosurgery      Allergies as of 07/24/2016      Reactions   Macrobid  [nitrofurantoin Macrocrystal]    Sulfa Antibiotics    Penicillins Rash   Thimerosol [thimerosal] Rash      Medication List      ALPRAZolam 0.25 MG tablet Commonly known as:  XANAX Take 1 tablet (0.25 mg total) by mouth 3 (three) times daily as needed for anxiety.   calcium carbonate 600 MG Tabs tablet Commonly known as:  OS-CAL Take 600 mg by mouth daily with breakfast.   cetirizine 10 MG tablet Commonly known as:  ZYRTEC Take 10 mg by mouth daily.   chlorthalidone 25 MG tablet Commonly known as:  HYGROTON TAKE 1 TABLET BY MOUTH ONCE DAILY   co-enzyme Q-10 50 MG capsule Take 50 mg by mouth daily.   estradiol 0.1 MG/GM vaginal cream Commonly known as:  ESTRACE 2 mg 2 (two) times a week.   hydrOXYzine 25 MG tablet Commonly known as:  ATARAX/VISTARIL Take 1 tablet (25 mg total) by mouth 3 (three) times daily.   losartan 100 MG tablet Commonly known as:  COZAAR TAKE  1 TABLET BY MOUTH ONCE DAILY   meloxicam 15 MG tablet Commonly known as:  MOBIC TK 1 T PO QD WF   metoprolol succinate 50 MG 24 hr tablet Commonly known as:  TOPROL-XL TAKE 1 TABLET BY MOUTH ONCE DAILY AFTER MEAL   multivitamin capsule Take 1 capsule by mouth daily.   phenylephrine 10 MG Tabs tablet Commonly known as:  SUDAFED PE Take 10 mg by mouth every 4 (four) hours as needed.   potassium chloride SA 20 MEQ tablet Commonly known as:  K-DUR,KLOR-CON TAKE 1 TABLET BY MOUTH TWICE A DAY   simvastatin 10 MG  tablet Commonly known as:  ZOCOR TAKE 1 TABLET BY MOUTH AT BEDTIME   triamcinolone cream 0.1 % Commonly known as:  KENALOG Apply 1 application topically 3 (three) times daily.   valACYclovir 500 MG tablet Commonly known as:  VALTREX TAKE 1 TABLET BY MOUTH TWICE A DAY. 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   HENT: Negative.   Eyes: Negative.   Respiratory: Negative.   Cardiovascular: Negative.   Gastrointestinal: Negative.   Genitourinary: Negative.   Musculoskeletal: Negative.   Skin: Negative.   Neurological: Negative.   Endo/Heme/Allergies: Negative.   Psychiatric/Behavioral: Negative.     Family History  Problem Relation Age of Onset  . Heart disease Mother     Social History   Social History  . Marital status: Married    Spouse name: N/A  . Number of children: N/A  . Years of education: N/A   Occupational History  . Not on file.   Social History Main Topics  . Smoking status: Never Smoker  . Smokeless tobacco: Never Used  . Alcohol use Yes     Comment: social  . Drug use: No  . Sexual activity: Not on file   Other Topics Concern  . Not on file   Social History Narrative  . No narrative on file    Environmental and Social history  Lives in a house with a dry environment, no animals located inside the household, carpeting in the bedroom, plastic on the bed, plastic on the pillow, no smokers located inside the household.  Objective:   Vitals:   07/24/16 0919  BP: 122/72  Pulse: 76  Resp: 18  Temp: (!) 97.5 F (36.4 C)   Height: 5\' 2"  (157.5 cm) Weight: 152 lb 3.2 oz (69 kg)  Physical Exam  Constitutional: She is well-developed, well-nourished, and in no distress.  HENT:  Head: Normocephalic. Head is without right periorbital erythema and without left periorbital erythema.  Right Ear:  Tympanic membrane, external ear and ear canal normal.  Left Ear: Tympanic membrane, external ear and ear canal normal.  Nose: Nose normal. No mucosal edema or rhinorrhea.  Mouth/Throat: Oropharynx is clear and moist and mucous membranes are normal. No oropharyngeal exudate.  Eyes: Pupils are equal, round, and reactive to light. Conjunctivae and lids are normal.  Neck: Trachea normal. No tracheal deviation present. No thyromegaly present.  Cardiovascular: Normal rate, regular rhythm, S1 normal, S2 normal and normal heart sounds.   No murmur heard. Pulmonary/Chest: Effort normal. No stridor. No tachypnea. No respiratory distress. She has no wheezes. She has no rales. She exhibits no tenderness.  Abdominal: Soft. She exhibits no distension and no mass. There is no hepatosplenomegaly.  There is no tenderness. There is no rebound and no guarding.  Musculoskeletal: She exhibits no edema or tenderness.  Lymphadenopathy:       Head (right side): No tonsillar adenopathy present.       Head (left side): No tonsillar adenopathy present.    She has no cervical adenopathy.    She has no axillary adenopathy.  Neurological: She is alert. Gait normal.  Skin: Rash (scattered urticarial lesions affectin and arms. Evidence of dermatographia) noted. She is not diaphoretic. No erythema. No pallor. Nails show no clubbing.  Psychiatric: Mood and affect normal.    Diagnostics: Allergy skin tests were performed. She demonstrated hypersensitivity to mold, house dust mite, cat, and to a lesser degree cockroach  Results of blood tests obtained 10/27/2015 identified normal hepatic and renal function, white blood cell count 4.4 with an absolute eosinophil count of 132, absolute lymphocyte count of 1496, hemoglobin 13.8, platelet 221, TSH 4.60 micro-international unit/liter.  Results of blood tests obtain one in June 2018 identified a TSH of 2.530 international unit/liter  Assessment and Plan:    1. Allergic urticaria    2. Other allergic rhinitis     1. Allergen avoidance measures. Discontinue all supplements  2. Preventative therapy:   A. cetirizine 10 mg tablet twice a day  B. ranitidine 150 mg tablet twice a day  C. montelukast 10 mg tablet once a day  D. OTC Nasacort/Rhinocort one spray each nostril once a day  3. Can add Benadryl if needed  4. Blood - CBC w/diff, CMP, TSH, T4, T.P. alpha gal, + UA  5. Further evaluation? Yes, if unresponsive to plan  6. 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 This could be tied up with her atopic disease and we willget her to perform allergen avoidance measures as best as possible. In addition, I have asked her to discontinue all of her supplements at this point in time. I will obtain the blood tests noted above in investigation of her overactive immune system. She did have evidence of possible thyroid inflammation in the past and this may also be tied up with her immunological hyperreactivity. I will regroup with her in 4 weeks or earlier if there is a problem.  Jiles Prows, MD [facility]

## 2016-07-24 NOTE — Patient Instructions (Addendum)
  1. Allergen avoidance measures. Discontinue all supplements  2. Preventative therapy:   A. cetirizine 10 mg tablet twice a day  B. ranitidine 150 mg tablet twice a day  C. montelukast 10 mg tablet once a day  D. OTC Nasacort/Rhinocort one spray each nostril once a day  3. Can add Benadryl if needed  4. Blood - CBC w/diff, CMP, TSH, T4, T.P. alpha gal, + UA  5. Further evaluation? Yes, if unresponsive to plan  6. Return to clinic in 4 weeks or earlier if problem

## 2016-07-26 DIAGNOSIS — Z124 Encounter for screening for malignant neoplasm of cervix: Secondary | ICD-10-CM | POA: Diagnosis not present

## 2016-07-26 DIAGNOSIS — Z01419 Encounter for gynecological examination (general) (routine) without abnormal findings: Secondary | ICD-10-CM | POA: Diagnosis not present

## 2016-07-26 DIAGNOSIS — R87610 Atypical squamous cells of undetermined significance on cytologic smear of cervix (ASC-US): Secondary | ICD-10-CM | POA: Diagnosis not present

## 2016-07-29 LAB — COMPREHENSIVE METABOLIC PANEL
ALT: 20 IU/L (ref 0–32)
AST: 17 IU/L (ref 0–40)
Albumin/Globulin Ratio: 1.8 (ref 1.2–2.2)
Albumin: 4.7 g/dL (ref 3.6–4.8)
Alkaline Phosphatase: 47 IU/L (ref 39–117)
BILIRUBIN TOTAL: 0.4 mg/dL (ref 0.0–1.2)
BUN/Creatinine Ratio: 22 (ref 12–28)
BUN: 17 mg/dL (ref 8–27)
CALCIUM: 10.2 mg/dL (ref 8.7–10.3)
CHLORIDE: 102 mmol/L (ref 96–106)
CO2: 23 mmol/L (ref 20–29)
Creatinine, Ser: 0.76 mg/dL (ref 0.57–1.00)
GFR, EST AFRICAN AMERICAN: 95 mL/min/{1.73_m2} (ref >59)
GFR, EST NON AFRICAN AMERICAN: 83 mL/min/{1.73_m2} (ref >59)
GLUCOSE: 102 mg/dL — AB (ref 65–99)
Globulin, Total: 2.6 g/dL (ref 1.5–4.5)
Potassium: 3.8 mmol/L (ref 3.5–5.2)
Sodium: 143 mmol/L (ref 134–144)
TOTAL PROTEIN: 7.3 g/dL (ref 6.0–8.5)

## 2016-07-29 LAB — ALPHA-GAL PANEL
Alpha Gal IgE*: <0.1 kU/L (ref <0.35)
Class Interpretation: 0
Class Interpretation: 0
LAMB CLASS INTERPRETATION: 0
Pork (Sus spp) IgE: <0.1 kU/L (ref <0.35)

## 2016-07-29 LAB — THYROID PEROXIDASE ANTIBODY: THYROID PEROXIDASE ANTIBODY: 19 [IU]/mL (ref 0–34)

## 2016-07-29 LAB — CBC WITH DIFFERENTIAL/PLATELET
BASOS ABS: 0 10*3/uL (ref 0.0–0.2)
Basos: 0 %
EOS (ABSOLUTE): 0.3 10*3/uL (ref 0.0–0.4)
Eos: 4 %
Hematocrit: 41.3 % (ref 34.0–46.6)
Hemoglobin: 14.1 g/dL (ref 11.1–15.9)
IMMATURE GRANS (ABS): 0 10*3/uL (ref 0.0–0.1)
IMMATURE GRANULOCYTES: 0 %
LYMPHS: 30 %
Lymphocytes Absolute: 1.7 10*3/uL (ref 0.7–3.1)
MCH: 30.7 pg (ref 26.6–33.0)
MCHC: 34.1 g/dL (ref 31.5–35.7)
MCV: 90 fL (ref 79–97)
Monocytes Absolute: 0.3 10*3/uL (ref 0.1–0.9)
Monocytes: 6 %
NEUTROS PCT: 60 %
Neutrophils Absolute: 3.4 10*3/uL (ref 1.4–7.0)
PLATELETS: 254 10*3/uL (ref 150–379)
RBC: 4.6 x10E6/uL (ref 3.77–5.28)
RDW: 13 % (ref 12.3–15.4)
WBC: 5.7 10*3/uL (ref 3.4–10.8)

## 2016-07-29 LAB — TSH: TSH: 2.1 u[IU]/mL (ref 0.450–4.500)

## 2016-07-29 LAB — URINALYSIS
BILIRUBIN UA: NEGATIVE
Glucose, UA: NEGATIVE
KETONES UA: NEGATIVE
Leukocytes, UA: NEGATIVE
NITRITE UA: NEGATIVE
Protein, UA: NEGATIVE
RBC, UA: NEGATIVE
Specific Gravity, UA: 1.021 (ref 1.005–1.030)
UUROB: 0.2 mg/dL (ref 0.2–1.0)
pH, UA: 5 (ref 5.0–7.5)

## 2016-07-29 LAB — T4, FREE: FREE T4: 1.26 ng/dL (ref 0.82–1.77)

## 2016-08-01 DIAGNOSIS — G8929 Other chronic pain: Secondary | ICD-10-CM | POA: Diagnosis not present

## 2016-08-01 DIAGNOSIS — M25561 Pain in right knee: Secondary | ICD-10-CM | POA: Diagnosis not present

## 2016-08-01 DIAGNOSIS — M1711 Unilateral primary osteoarthritis, right knee: Secondary | ICD-10-CM | POA: Diagnosis not present

## 2016-08-20 ENCOUNTER — Ambulatory Visit (INDEPENDENT_AMBULATORY_CARE_PROVIDER_SITE_OTHER): Payer: PPO | Admitting: Allergy and Immunology

## 2016-08-20 ENCOUNTER — Encounter: Payer: Self-pay | Admitting: Allergy and Immunology

## 2016-08-20 VITALS — BP 122/68 | HR 80 | Resp 12

## 2016-08-20 DIAGNOSIS — L500 Allergic urticaria: Secondary | ICD-10-CM

## 2016-08-20 MED ORDER — METHYLPREDNISOLONE ACETATE 80 MG/ML IJ SUSP
80.0000 mg | Freq: Once | INTRAMUSCULAR | Status: AC
  Administered 2016-08-20: 80 mg via INTRAMUSCULAR

## 2016-08-20 NOTE — Patient Instructions (Signed)
  1. Start a course of omalizumab/Xolair  2. Preventative therapy:   A. cetirizine 10 mg tablet twice a day  B. ranitidine 150 mg tablet twice a day  C. montelukast 10 mg tablet once a day  D. OTC Nasacort/Rhinocort one spray each nostril once a day  3. Can add Benadryl if needed  4. Depo-Medrol 80 IM delivered in clinic today   5. Obtain fall flu vaccine  6. Return to clinic in 12 weeks or earlier if problem

## 2016-08-20 NOTE — Progress Notes (Signed)
Follow-up Note  Referring Provider: Elby Showers, MD Primary Provider: Elby Showers, MD Date of Office Visit: 08/20/2016  Subjective:   Sally Hopkins (DOB: 1950/06/22) is a 66 y.o. female who returns to the [facility] on 08/20/2016 in re-evaluation of the following:  HPI: Sally Hopkins returns to this clinic in reevaluation of her urticarial reaction and allergic rhinitis. I last saw her in this clinic during her initial evaluation of 07/24/2016.  Her skin is still extremely itchy and she still has urticarial lesions almost every day. Therapy prescribed during her last visit has not really helped her.  Her nose is doing very well while using a nasal steroid however.  Allergies as of 08/20/2016      Reactions   Macrobid  [nitrofurantoin Macrocrystal]    Sulfa Antibiotics    Penicillins Rash   Thimerosol [thimerosal] Rash      Medication List      ALPRAZolam 0.25 MG tablet Commonly known as:  XANAX Take 1 tablet (0.25 mg total) by mouth 3 (three) times daily as needed for anxiety.   calcium carbonate 600 MG Tabs tablet Commonly known as:  OS-CAL Take 600 mg by mouth daily with breakfast.   cetirizine 10 MG tablet Commonly known as:  ZYRTEC Take 10 mg by mouth daily.   chlorthalidone 25 MG tablet Commonly known as:  HYGROTON TAKE 1 TABLET BY MOUTH ONCE DAILY   co-enzyme Q-10 50 MG capsule Take 50 mg by mouth daily.   estradiol 0.1 MG/GM vaginal cream Commonly known as:  ESTRACE 2 mg 2 (two) times a week.   hydrOXYzine 25 MG tablet Commonly known as:  ATARAX/VISTARIL Take 1 tablet (25 mg total) by mouth 3 (three) times daily.   losartan 100 MG tablet Commonly known as:  COZAAR TAKE 1 TABLET BY MOUTH ONCE DAILY   meloxicam 15 MG tablet Commonly known as:  MOBIC TK 1 T PO QD WF   metoprolol succinate 50 MG 24 hr tablet Commonly known as:  TOPROL-XL TAKE 1 TABLET BY MOUTH ONCE DAILY AFTER MEAL   montelukast 10 MG tablet Commonly known  as:  SINGULAIR Take 1 tablet (10 mg total) by mouth at bedtime.   multivitamin capsule Take 1 capsule by mouth daily.   phenylephrine 10 MG Tabs tablet Commonly known as:  SUDAFED PE Take 10 mg by mouth every 4 (four) hours as needed.   potassium chloride SA 20 MEQ tablet Commonly known as:  K-DUR,KLOR-CON TAKE 1 TABLET BY MOUTH TWICE A DAY   ranitidine 150 MG capsule Commonly known as:  ZANTAC Take 1 capsule (150 mg total) by mouth 2 (two) times daily.   simvastatin 10 MG tablet Commonly known as:  ZOCOR TAKE 1 TABLET BY MOUTH AT BEDTIME   triamcinolone cream 0.1 % Commonly known as:  KENALOG Apply 1 application topically 3 (three) times daily.   valACYclovir 500 MG tablet Commonly known as:  VALTREX TAKE 1 TABLET BY MOUTH TWICE A DAY. 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 HSV-1 infection   . Hypertension   . Meniere's disease     Past Surgical History:  Procedure Laterality Date  . cervical cryosurgery      Review of systems negative except as noted in HPI / PMHx or noted below:  Review of Systems  Constitutional: Negative.   HENT: Negative.  Eyes: Negative.   Respiratory: Negative.   Cardiovascular: Negative.   Gastrointestinal: Negative.   Genitourinary: Negative.   Musculoskeletal: Negative.   Skin: Negative.   Neurological: Negative.   Endo/Heme/Allergies: Negative.   Psychiatric/Behavioral: Negative.      Objective:   Vitals:   08/20/16 1555  BP: 122/68  Pulse: 80  Resp: 12          Physical Exam  Skin: Rash (scattered urticarial lesions across extremities) noted.    Diagnostics:    Results of blood tests obtained 07/24/2016 identified normal hepatic and renal function, white blood cell 5.7 with an absolute eosinophil count of 300, hemoglobin 14.1, platelet 254, negative alpha gal panel, normal urine analysis,  Assessment and Plan:   1. Allergic  urticaria     1. Start a course of omalizumab/Xolair  2. Preventative therapy:   A. cetirizine 10 mg tablet twice a day  B. ranitidine 150 mg tablet twice a day  C. montelukast 10 mg tablet once a day  D. OTC Nasacort/Rhinocort one spray each nostril once a day  3. Can add Benadryl if needed  4. Depo-Medrol 80 IM delivered in clinic today   5. Obtain fall flu vaccine  6. Return to clinic in 12 weeks or earlier if problem  Sally Hopkins has failed medical therapy and we will now start her on a course of omalizumab. Hopefully she will obtain benefit over the course of the next several weeks. I did give her a systemic steroid during today's evaluation as well while we await insurance approval for her omalizumab.. I will see her back in this clinic in 12 weeks or earlier if there is a problem.  Sally Katz, MD Allergy / Immunology [place]

## 2016-09-02 ENCOUNTER — Telehealth: Payer: Self-pay

## 2016-09-02 MED ORDER — POTASSIUM CHLORIDE CRYS ER 20 MEQ PO TBCR: 20.0000 meq | EXTENDED_RELEASE_TABLET | Freq: Two times a day (BID) | ORAL | 0 refills | Status: DC

## 2016-09-02 NOTE — Telephone Encounter (Signed)
Received fax from [facility] in regards to a refill on potassium  for patient. Medication was refilled per Dr. Verlene Mayer request. Sent 2 month supply no refills.

## 2016-09-16 ENCOUNTER — Other Ambulatory Visit: Payer: Self-pay | Admitting: *Deleted

## 2016-09-16 MED ORDER — TRIAMCINOLONE ACETONIDE 0.1 % EX CREA: 1.0000 "application " | TOPICAL_CREAM | Freq: Three times a day (TID) | CUTANEOUS | 4 refills | Status: DC

## 2016-10-04 DIAGNOSIS — N879 Dysplasia of cervix uteri, unspecified: Secondary | ICD-10-CM | POA: Diagnosis not present

## 2016-10-04 DIAGNOSIS — A630 Anogenital (venereal) warts: Secondary | ICD-10-CM | POA: Diagnosis not present

## 2016-10-04 DIAGNOSIS — R87619 Unspecified abnormal cytological findings in specimens from cervix uteri: Secondary | ICD-10-CM | POA: Diagnosis not present

## 2016-10-14 ENCOUNTER — Other Ambulatory Visit: Payer: Self-pay | Admitting: Internal Medicine

## 2016-10-25 ENCOUNTER — Ambulatory Visit (INDEPENDENT_AMBULATORY_CARE_PROVIDER_SITE_OTHER): Payer: PPO | Admitting: Internal Medicine

## 2016-10-25 VITALS — BP 128/78 | HR 73 | Temp 98.7°F | Wt 155.0 lb

## 2016-10-25 DIAGNOSIS — J22 Unspecified acute lower respiratory infection: Secondary | ICD-10-CM

## 2016-10-25 MED ORDER — HYDROCODONE-HOMATROPINE 5-1.5 MG/5ML PO SYRP: 5.0000 mL | ORAL_SOLUTION | Freq: Three times a day (TID) | ORAL | 0 refills | Status: DC | PRN

## 2016-10-25 MED ORDER — FLUCONAZOLE 150 MG PO TABS: 150.0000 mg | ORAL_TABLET | Freq: Once | ORAL | 0 refills | Status: AC

## 2016-10-25 MED ORDER — AZITHROMYCIN 250 MG PO TABS: ORAL_TABLET | ORAL | 0 refills | Status: DC

## 2016-10-25 NOTE — Progress Notes (Signed)
   Subjective:    Patient ID: Sally Hopkins, female    DOB: 1950/05/06, 66 y.o.   MRN: [id number]  HPI 66 year old Female with URI symptoms since end of September. Grandchild has had RSV.  Husband was in ED end of September after mitral valve replacement and pt.thinks she caught URI then. Pt has cough with discolored nasal drainage.  Cannot seem to get rid of cough.  Sounds nasally congested.  No fever or shaking chills.    Review of Systems see above     Objective:   Physical Exam Skin warm and dry.  Nodes none.  Pharynx is clear.  TMs slightly full.  Neck supple without adenopathy.  Chest clear to auscultation without rales or wheezing.       Assessment & Plan:  Acute lower respiratory infection  Probable maxillary sinusitis  Plan: Zithromax Z-Pak take 2 tablets day 1 followed by 1 tablet days 2 through 5.  Hycodan 1 teaspoon p.o. every 8 hours as needed cough.

## 2016-10-27 ENCOUNTER — Encounter: Payer: Self-pay | Admitting: Internal Medicine

## 2016-10-28 ENCOUNTER — Other Ambulatory Visit (INDEPENDENT_AMBULATORY_CARE_PROVIDER_SITE_OTHER): Payer: PPO | Admitting: Internal Medicine

## 2016-10-28 DIAGNOSIS — H8109 Meniere's disease, unspecified ear: Secondary | ICD-10-CM

## 2016-10-28 DIAGNOSIS — I10 Essential (primary) hypertension: Secondary | ICD-10-CM | POA: Diagnosis not present

## 2016-10-28 DIAGNOSIS — E785 Hyperlipidemia, unspecified: Secondary | ICD-10-CM

## 2016-10-28 DIAGNOSIS — J309 Allergic rhinitis, unspecified: Secondary | ICD-10-CM | POA: Diagnosis not present

## 2016-10-28 DIAGNOSIS — B009 Herpesviral infection, unspecified: Secondary | ICD-10-CM

## 2016-10-28 DIAGNOSIS — Z0000 Encounter for general adult medical examination without abnormal findings: Secondary | ICD-10-CM | POA: Diagnosis not present

## 2016-10-28 LAB — CBC WITH DIFFERENTIAL/PLATELET
BASOS ABS: 52 {cells}/uL (ref 0–200)
Basophils Relative: 1.1 %
EOS ABS: 357 {cells}/uL (ref 15–500)
Eosinophils Relative: 7.6 %
HEMATOCRIT: 39.1 % (ref 35.0–45.0)
HEMOGLOBIN: 13.3 g/dL (ref 11.7–15.5)
LYMPHS ABS: 1001 {cells}/uL (ref 850–3900)
MCH: 30.4 pg (ref 27.0–33.0)
MCHC: 34 g/dL (ref 32.0–36.0)
MCV: 89.3 fL (ref 80.0–100.0)
MPV: 10.4 fL (ref 7.5–12.5)
Monocytes Relative: 8.9 %
NEUTROS ABS: 2872 {cells}/uL (ref 1500–7800)
Neutrophils Relative %: 61.1 %
Platelets: 233 10*3/uL (ref 140–400)
RBC: 4.38 10*6/uL (ref 3.80–5.10)
RDW: 12.5 % (ref 11.0–15.0)
Total Lymphocyte: 21.3 %
WBC: 4.7 10*3/uL (ref 3.8–10.8)
WBCMIX: 418 {cells}/uL (ref 200–950)

## 2016-10-28 LAB — COMPLETE METABOLIC PANEL WITH GFR
AG RATIO: 1.6 (calc) (ref 1.0–2.5)
ALBUMIN MSPROF: 4.2 g/dL (ref 3.6–5.1)
ALKALINE PHOSPHATASE (APISO): 44 U/L (ref 33–130)
ALT: 15 U/L (ref 6–29)
AST: 14 U/L (ref 10–35)
BUN: 21 mg/dL (ref 7–25)
CO2: 29 mmol/L (ref 20–32)
CREATININE: 0.9 mg/dL (ref 0.50–0.99)
Calcium: 9.5 mg/dL (ref 8.6–10.4)
Chloride: 102 mmol/L (ref 98–110)
GFR, EST NON AFRICAN AMERICAN: 67 mL/min/{1.73_m2} (ref >=60)
GFR, Est African American: 78 mL/min/{1.73_m2} (ref >=60)
GLOBULIN: 2.6 g/dL (ref 1.9–3.7)
Glucose, Bld: 104 mg/dL — ABNORMAL HIGH (ref 65–99)
POTASSIUM: 3.9 mmol/L (ref 3.5–5.3)
SODIUM: 138 mmol/L (ref 135–146)
Total Bilirubin: 0.6 mg/dL (ref 0.2–1.2)
Total Protein: 6.8 g/dL (ref 6.1–8.1)

## 2016-10-28 LAB — TSH: TSH: 2.37 mIU/L (ref 0.40–4.50)

## 2016-10-28 LAB — LIPID PANEL
CHOL/HDL RATIO: 3 (calc) (ref <5.0)
CHOLESTEROL: 178 mg/dL (ref <200)
HDL: 60 mg/dL (ref >50)
LDL Cholesterol (Calc): 99 mg/dL (calc)
Non-HDL Cholesterol (Calc): 118 mg/dL (calc) (ref <130)
Triglycerides: 93 mg/dL (ref <150)

## 2016-10-30 DIAGNOSIS — M25561 Pain in right knee: Secondary | ICD-10-CM | POA: Diagnosis not present

## 2016-10-30 DIAGNOSIS — M1711 Unilateral primary osteoarthritis, right knee: Secondary | ICD-10-CM | POA: Diagnosis not present

## 2016-10-30 DIAGNOSIS — G8929 Other chronic pain: Secondary | ICD-10-CM | POA: Diagnosis not present

## 2016-10-31 ENCOUNTER — Ambulatory Visit (INDEPENDENT_AMBULATORY_CARE_PROVIDER_SITE_OTHER): Payer: PPO | Admitting: Internal Medicine

## 2016-10-31 ENCOUNTER — Encounter: Payer: Self-pay | Admitting: Internal Medicine

## 2016-10-31 VITALS — BP 136/80 | HR 86 | Temp 98.5°F | Ht 61.5 in | Wt 153.0 lb

## 2016-10-31 DIAGNOSIS — B009 Herpesviral infection, unspecified: Secondary | ICD-10-CM | POA: Diagnosis not present

## 2016-10-31 DIAGNOSIS — L500 Allergic urticaria: Secondary | ICD-10-CM

## 2016-10-31 DIAGNOSIS — E7849 Other hyperlipidemia: Secondary | ICD-10-CM

## 2016-10-31 DIAGNOSIS — J0100 Acute maxillary sinusitis, unspecified: Secondary | ICD-10-CM

## 2016-10-31 DIAGNOSIS — I10 Essential (primary) hypertension: Secondary | ICD-10-CM

## 2016-10-31 DIAGNOSIS — R8789 Other abnormal findings in specimens from female genital organs: Secondary | ICD-10-CM | POA: Diagnosis not present

## 2016-10-31 DIAGNOSIS — Z9889 Other specified postprocedural states: Secondary | ICD-10-CM | POA: Diagnosis not present

## 2016-10-31 DIAGNOSIS — Z0000 Encounter for general adult medical examination without abnormal findings: Secondary | ICD-10-CM

## 2016-10-31 DIAGNOSIS — R87618 Other abnormal cytological findings on specimens from cervix uteri: Secondary | ICD-10-CM

## 2016-10-31 DIAGNOSIS — H8109 Meniere's disease, unspecified ear: Secondary | ICD-10-CM

## 2016-10-31 LAB — POCT URINALYSIS DIPSTICK
Bilirubin, UA: NEGATIVE
GLUCOSE UA: NEGATIVE
Ketones, UA: NEGATIVE
Leukocytes, UA: NEGATIVE
NITRITE UA: NEGATIVE
PROTEIN UA: NEGATIVE
RBC UA: NEGATIVE
Spec Grav, UA: 1.025 (ref 1.010–1.025)
UROBILINOGEN UA: 0.2 U/dL
pH, UA: 6.5 (ref 5.0–8.0)

## 2016-10-31 NOTE — Patient Instructions (Addendum)
One gram IM Rocephin. Call if not better after return from [place].  Continue same medications and return in 1 year or as needed.  Have annual mammogram.  Had flu vaccine.  Need follow-up colonoscopy.

## 2016-10-31 NOTE — Progress Notes (Signed)
Subjective:    Patient ID: Sally Hopkins, female    DOB: [birth year], 66 y.o.   MRN: [id number]  HPI 66 year old female in today for health maintenance exam, welcome to Medicare exam and evaluation of medical issues.  History of hyperlipidemia, essential hypertension, allergic rhinitis, herpes simplex type II.  She takes potassium supplement due to hypokalemia from diuretic therapy.  History of colonoscopy 2004.  Needs follow-up procedure.  History of Mnire's disease stable and in remission.  Had cryosurgery of the cervix 2006.  Had endocervical biopsy March 2017 and again September 2018 by Dr. Paula Compton.  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  No evidence of malignancy.   Hx allergic uriticaria  and allergic rhinitis seen by Dr. Neldon Mc.  Social history: She is married with 2 adult children.  Does not smoke.  Drinks 8 ounces of wine daily.  Graduate of [employer].  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  Family history: Father died at age 58 of ALS but also had colon cancer.  Mother with history of open heart surgery 1969 for congenital heart disease and also had CABG.  One sister in good health.  Patient is allergic to thimerosal which has been used as a preservative in vaccines but she may take the annual flu vaccine since single dose vials do not contain thimerosal.  History of mildly elevated serum glucose.  Hemoglobin A1c has been followed and within normal limits.  Highest A1c was 5.8% 3 years ago.  Last year it was 5.3% and we did not check it this year  Review of Systems sinusitis symptoms     Objective:   Physical Exam  Constitutional: She is oriented to person, place, and time. She appears well-developed and well-nourished. No distress.  HENT:  Head: Normocephalic and atraumatic.  Right Ear: External ear normal.  Left Ear: External ear normal.  Mouth/Throat: Oropharynx is clear and moist.  Eyes: Pupils are equal, round, and reactive to  light. Conjunctivae and EOM are normal. Right eye exhibits no discharge. Left eye exhibits no discharge.  Neck: Neck supple. No JVD present. No thyromegaly present.  Cardiovascular: Normal rate, regular rhythm, normal heart sounds and intact distal pulses.   No murmur heard. Pulmonary/Chest: Effort normal and breath sounds normal. No respiratory distress. She has no wheezes. She has no rales.  Abdominal: Soft. Bowel sounds are normal. She exhibits no distension and no mass. There is no tenderness. There is no rebound and no guarding.  Genitourinary:  Genitourinary Comments: Deferred to GYN  Musculoskeletal: Normal range of motion. She exhibits no edema.  Lymphadenopathy:    She has no cervical adenopathy.  Neurological: She is alert and oriented to person, place, and time. She has normal reflexes. No cranial nerve deficit.  Skin: Skin is warm and dry. No rash noted. She is not diaphoretic.  Psychiatric: She has a normal mood and affect. Her behavior is normal. Judgment and thought content normal.  Vitals reviewed.         Assessment & Plan:  Acute sinusitis- finishing Z-pak going to [place] this weekend.  Feeling better.  1 g IM Rocephin.  Call if not better after return from [place].  History of HPV and abnormal Pap smear status post cervical biopsy this year and last with no evidence of malignancy.  Thimerosal allergy  Allergic urticaria  Essential hypertension  Hyperlipidemia  Hypokalemia secondary to diuretic therapy  History of Mnire's disease  Allergic rhinitis  Plan: Continue  same medications.  Finish Z-Pak.  Return in 1 year or as needed.  Continue GYN follow-up.  Need follow-up colonoscopy.  Subjective:   Patient presents for Medicare Annual/Subsequent preventive examination.  Review Past Medical/Family/Social: See above   Risk Factors  Current exercise habits: Physically active Dietary issues discussed: Low-fat low carbohydrate  Cardiac risk factors:  Hyperlipidemia, hypertension, family history  Depression Screen  (Note: if answer to either of the following is "Yes", a more complete depression screening is indicated)   Over the past two weeks, have you felt down, depressed or hopeless? No  Over the past two weeks, have you felt little interest or pleasure in doing things? No Have you lost interest or pleasure in daily life? No Do you often feel hopeless? No Do you cry easily over simple problems? No   Activities of Daily Living  In your present state of health, do you have any difficulty performing the following activities?:   Driving? No  Managing money? No  Feeding yourself? No  Getting from bed to chair? No  Climbing a flight of stairs? No  Preparing food and eating?: No  Bathing or showering? No  Getting dressed: No  Getting to the toilet? No  Using the toilet:No  Moving around from place to place: No  In the past year have you fallen or had a near fall?:No  Are you sexually active?  Yes Do you have more than one partner? No   Hearing Difficulties: he has history of Mnire's disease and hearing loss in right ear Do you often ask people to speak up or repeat themselves?  yes Do you experience ringing or noises in your ears?  Yes Do you have difficulty understanding soft or whispered voices?  Yes Do you feel that you have a problem with memory? No Do you often misplace items? No    Home Safety:  Do you have a smoke alarm at your residence? Yes Do you have grab bars in the bathroom?  No Do you have throw rugs in your house?   Cognitive Testing  Alert? Yes Normal Appearance?Yes  Oriented to person? Yes Place? Yes  Time? Yes  Recall of three objects? Yes  Can perform simple calculations? Yes  Displays appropriate judgment?Yes  Can read the correct time from a watch face?Yes   List the Names of Other Physician/Practitioners you currently use:  See referral list for the physicians patient is currently seeing.      Review of Systems: See above   Objective:     General appearance: Appears  younger than stated age  Head: Normocephalic, without obvious abnormality, atraumatic  Eyes: conj clear, EOMi PEERLA  Ears: normal TM's and external ear canals both ears  Nose: Nares normal. Septum midline. Mucosa normal. No drainage or sinus tenderness.  Throat: lips, mucosa, and tongue normal; teeth and gums normal  Neck: no adenopathy, no carotid bruit, no JVD, supple, symmetrical, trachea midline and thyroid not enlarged, symmetric, no tenderness/mass/nodules  No CVA tenderness.  Lungs: clear to auscultation bilaterally  Breasts: normal appearance, no masses or tenderness  Heart: regular rate and rhythm, S1, S2 normal, no murmur, click, rub or gallop  Abdomen: soft, non-tender; bowel sounds normal; no masses, no organomegaly  Musculoskeletal: ROM normal in all joints, no crepitus, no deformity, Normal muscle strengthen. Back  is symmetric, no curvature. Skin: Skin color, texture, turgor normal. No rashes or lesions  Lymph nodes: Cervical, supraclavicular, and axillary nodes normal.  Neurologic: CN 2 -12 Normal, Normal  symmetric reflexes. Normal coordination and gait  Psych: Alert & Oriented x 3, Mood appear stable.    Assessment:    Annual wellness medicare exam   Plan:    During the course of the visit the patient was educated and counseled about appropriate screening and preventive services including:   Annual mammogram  Annual flu vaccine     Patient Instructions (the written plan) was given to the patient.  Medicare Attestation  I have personally reviewed:  The patient's medical and social history  Their use of alcohol, tobacco or illicit drugs  Their current medications and supplements  The patient's functional ability including ADLs,fall risks, home safety risks, cognitive, and hearing and visual impairment  Diet and physical activities  Evidence for depression or mood  disorders  The patient's weight, height, BMI, and visual acuity have been recorded in the chart. I have made referrals, counseling, and provided education to the patient based on review of the above and I have provided the patient with a written personalized care plan for preventive services.

## 2016-11-01 ENCOUNTER — Telehealth: Payer: Self-pay | Admitting: *Deleted

## 2016-11-01 NOTE — Telephone Encounter (Signed)
Had initially L/M for patient to call regarding starting Xolair for CIU per Dr Neldon Mc request on 08/20/16.  Also called L/M for patient on 09/16/16 in which she called back.  I discussed with patient options regarding assistance for Xolair and mailed her paperwork to return to me to submit to foundation for free drug. L/M again on 10/18/16 to inquire if she received paperwork and was still interested in Onset therapy and again on 10/25/16 with no response.  At this time I will assume she is no longer interested and will await her to make contact me in the future if she wants to start

## 2016-11-05 DIAGNOSIS — L500 Allergic urticaria: Secondary | ICD-10-CM | POA: Insufficient documentation

## 2016-11-05 DIAGNOSIS — R87618 Other abnormal cytological findings on specimens from cervix uteri: Secondary | ICD-10-CM | POA: Insufficient documentation

## 2016-11-05 DIAGNOSIS — R8789 Other abnormal findings in specimens from female genital organs: Secondary | ICD-10-CM | POA: Insufficient documentation

## 2016-11-20 ENCOUNTER — Ambulatory Visit (INDEPENDENT_AMBULATORY_CARE_PROVIDER_SITE_OTHER): Payer: PPO | Admitting: Internal Medicine

## 2016-11-20 VITALS — BP 124/72 | HR 69 | Temp 98.3°F

## 2016-11-20 DIAGNOSIS — Z23 Encounter for immunization: Secondary | ICD-10-CM | POA: Diagnosis not present

## 2016-11-20 NOTE — Progress Notes (Signed)
Prevnar 13 given

## 2016-11-20 NOTE — Progress Notes (Signed)
Prevnar 13 given today in the left arm. Pt tolerated well.  Alvah Gilder/CMA

## 2016-11-20 NOTE — Patient Instructions (Signed)
Prevnar 13 given today

## 2016-11-25 ENCOUNTER — Other Ambulatory Visit: Payer: Self-pay | Admitting: Internal Medicine

## 2016-12-05 ENCOUNTER — Encounter: Payer: Self-pay | Admitting: Internal Medicine

## 2016-12-05 DIAGNOSIS — Z1231 Encounter for screening mammogram for malignant neoplasm of breast: Secondary | ICD-10-CM | POA: Diagnosis not present

## 2017-01-10 ENCOUNTER — Other Ambulatory Visit: Payer: Self-pay | Admitting: Allergy and Immunology

## 2017-01-10 ENCOUNTER — Other Ambulatory Visit: Payer: Self-pay | Admitting: Internal Medicine

## 2017-02-04 DIAGNOSIS — M179 Osteoarthritis of knee, unspecified: Secondary | ICD-10-CM | POA: Insufficient documentation

## 2017-02-04 DIAGNOSIS — M1710 Unilateral primary osteoarthritis, unspecified knee: Secondary | ICD-10-CM | POA: Insufficient documentation

## 2017-02-04 DIAGNOSIS — M25561 Pain in right knee: Secondary | ICD-10-CM | POA: Insufficient documentation

## 2017-02-06 ENCOUNTER — Other Ambulatory Visit: Payer: Self-pay | Admitting: Allergy and Immunology

## 2017-02-06 DIAGNOSIS — M1711 Unilateral primary osteoarthritis, right knee: Secondary | ICD-10-CM | POA: Diagnosis not present

## 2017-02-06 DIAGNOSIS — M25561 Pain in right knee: Secondary | ICD-10-CM | POA: Diagnosis not present

## 2017-02-07 ENCOUNTER — Other Ambulatory Visit: Payer: Self-pay | Admitting: Allergy and Immunology

## 2017-02-07 MED ORDER — MONTELUKAST SODIUM 10 MG PO TABS: 10.0000 mg | ORAL_TABLET | Freq: Every day | ORAL | 0 refills | Status: DC

## 2017-02-07 MED ORDER — RANITIDINE HCL 150 MG PO TABS: 150.0000 mg | ORAL_TABLET | Freq: Two times a day (BID) | ORAL | 0 refills | Status: DC

## 2017-02-07 NOTE — Telephone Encounter (Signed)
Prescriptions sent with notation to keep the scheduled office visit.

## 2017-02-07 NOTE — Telephone Encounter (Signed)
Patient requested medication for montelukast and ranitidine and it was denied because she needed an appointment. She has schedule the appointment for 03-04-17. The pharmacy told her she would have to call us and authorize them to fill it for her. [employer]. She said she usually gets a 90 day supply, but if you won't do that until her appointment, she will take the 30 day supply.

## 2017-03-04 ENCOUNTER — Ambulatory Visit (INDEPENDENT_AMBULATORY_CARE_PROVIDER_SITE_OTHER): Payer: PPO | Admitting: Allergy and Immunology

## 2017-03-04 ENCOUNTER — Encounter: Payer: Self-pay | Admitting: Allergy and Immunology

## 2017-03-04 VITALS — BP 148/78 | HR 72 | Resp 16

## 2017-03-04 DIAGNOSIS — J3089 Other allergic rhinitis: Secondary | ICD-10-CM

## 2017-03-04 DIAGNOSIS — L500 Allergic urticaria: Secondary | ICD-10-CM

## 2017-03-04 MED ORDER — MONTELUKAST SODIUM 10 MG PO TABS: 10.0000 mg | ORAL_TABLET | Freq: Every day | ORAL | 5 refills | Status: DC

## 2017-03-04 NOTE — Progress Notes (Signed)
Follow-up Note  Referring Provider: Elby Showers, MD Primary Provider: Elby Showers, MD Date of Office Visit: 03/04/2017  Subjective:   Sally Hopkins (DOB: [birth year]) is a 67 y.o. female who returns to the [facility] on 03/04/2017 in re-evaluation of the following:  HPI: Nikiya presents to this clinic in evaluation of urticaria and allergic rhinitis.  I last saw her in this clinic 20 August 2016.  She has really done very well on her current medical plan which includes cetirizine and ranitidine and montelukast.  She has a little bit of itchiness on occasion but no frank urticarial lesions.  Her nose is doing well and she no longer using a nasal steroid at this point.  She obtained the flu vaccine this year.  Allergies as of 03/04/2017      Reactions   Erythromycin    Macrobid  [nitrofurantoin Macrocrystal]    Sulfa Antibiotics    Penicillins Rash   Thimerosol [thimerosal] Rash      Medication List      ALPRAZolam 0.25 MG tablet Commonly known as:  XANAX Take 1 tablet (0.25 mg total) by mouth 3 (three) times daily as needed for anxiety.   cetirizine 10 MG tablet Commonly known as:  ZYRTEC Take 10 mg by mouth daily.   chlorthalidone 25 MG tablet Commonly known as:  HYGROTON TAKE 1 TABLET BY MOUTH DAILY.   estradiol 0.1 MG/GM vaginal cream Commonly known as:  ESTRACE 2 mg 2 (two) times a week.   HYDROcodone-homatropine 5-1.5 MG/5ML syrup Commonly known as:  HYCODAN Take 5 mLs by mouth every 8 (eight) hours as needed for cough.   hydrOXYzine 25 MG tablet Commonly known as:  ATARAX/VISTARIL Take 1 tablet (25 mg total) by mouth 3 (three) times daily.   losartan 100 MG tablet Commonly known as:  COZAAR TAKE 1 TABLET BY MOUTH DAILY.   metoprolol succinate 50 MG 24 hr tablet Commonly known as:  TOPROL-XL TAKE 1 TABLET DAILY AFTER A MEAL.   montelukast 10 MG tablet Commonly known as:  SINGULAIR Take 1 tablet (10 mg total) by mouth at  bedtime.   multivitamin capsule Take 1 capsule by mouth daily.   phenylephrine 10 MG Tabs tablet Commonly known as:  SUDAFED PE Take 10 mg by mouth every 4 (four) hours as needed.   potassium chloride SA 20 MEQ tablet Commonly known as:  K-DUR,KLOR-CON TAKE 1 TABLET BY MOUTH TWICE DAILY.   ranitidine 150 MG tablet Commonly known as:  ZANTAC Take 1 tablet (150 mg total) by mouth 2 (two) times daily.   simvastatin 10 MG tablet Commonly known as:  ZOCOR TAKE ONE TABLET AT BEDTIME.   triamcinolone cream 0.1 % Commonly known as:  KENALOG Apply 1 application topically 3 (three) times daily.   valACYclovir 500 MG tablet Commonly known as:  VALTREX TAKE 1 TABLET BY MOUTH TWICE A DAY. 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 HSV-1 infection   . Hypertension   . Meniere's disease     Past Surgical History:  Procedure Laterality Date  . cervical cryosurgery      Review of systems negative except as noted in HPI / PMHx or noted below:  Review of Systems  Constitutional: Negative.   HENT: Negative.   Eyes: Negative.   Respiratory: Negative.   Cardiovascular: Negative.   Gastrointestinal: Negative.  Genitourinary: Negative.   Musculoskeletal: Negative.   Skin: Negative.   Neurological: Negative.   Endo/Heme/Allergies: Negative.   Psychiatric/Behavioral: Negative.      Objective:   Vitals:   03/04/17 1101  BP: (!) 148/78  Pulse: 72  Resp: 16          Physical Exam  Constitutional: She is well-developed, well-nourished, and in no distress.  HENT:  Head: Normocephalic.  Right Ear: Tympanic membrane, external ear and ear canal normal.  Left Ear: Tympanic membrane, external ear and ear canal normal.  Nose: Nose normal. No mucosal edema or rhinorrhea.  Mouth/Throat: Uvula is midline, oropharynx is clear and moist and mucous membranes are normal. No oropharyngeal exudate.  Eyes:  Conjunctivae are normal.  Neck: Trachea normal. No tracheal tenderness present. No tracheal deviation present. No thyromegaly present.  Cardiovascular: Normal rate, regular rhythm, S1 normal, S2 normal and normal heart sounds.  No murmur heard. Pulmonary/Chest: Breath sounds normal. No stridor. No respiratory distress. She has no wheezes. She has no rales.  Musculoskeletal: She exhibits no edema.  Lymphadenopathy:       Head (right side): No tonsillar adenopathy present.       Head (left side): No tonsillar adenopathy present.    She has no cervical adenopathy.  Neurological: She is alert. Gait normal.  Skin: No rash noted. She is not diaphoretic. No erythema. Nails show no clubbing.  Psychiatric: Mood and affect normal.    Diagnostics: none  Assessment and Plan:   1. Allergic urticaria   2. Other allergic rhinitis     1. Continue of Preventative therapy:   A. cetirizine 10 mg tablet twice a day  B. montelukast 10 mg tablet once a day  C. Discontinue Ranitidine  2. Can add Benadryl if needed  3. Return to clinic in Summer 2019 or earlier if problem  Glori has really done very well since her last visit and we will see if we can consolidate her medical treatment by discontinuing her ranitidine.  I will see her back in this clinic in the summer 2019 and there may be an opportunity to further consolidate her treatment with that visit.  Allena Katz, MD Allergy / Immunology [place]

## 2017-03-04 NOTE — Patient Instructions (Addendum)
  1. Continue of Preventative therapy:   A. cetirizine 10 mg tablet twice a day  B. montelukast 10 mg tablet once a day  C. Discontinue Ranitidine  2. Can add Benadryl if needed  3. Return to clinic in Summer 2019 or earlier if problem

## 2017-03-05 ENCOUNTER — Encounter: Payer: Self-pay | Admitting: Allergy and Immunology

## 2017-03-12 ENCOUNTER — Other Ambulatory Visit: Payer: Self-pay | Admitting: Allergy and Immunology

## 2017-04-09 ENCOUNTER — Other Ambulatory Visit: Payer: Self-pay | Admitting: Internal Medicine

## 2017-04-09 NOTE — Telephone Encounter (Signed)
Book CPE after October 25 before refilling through October

## 2017-05-01 ENCOUNTER — Other Ambulatory Visit: Payer: Self-pay

## 2017-05-01 MED ORDER — VALACYCLOVIR HCL 500 MG PO TABS: ORAL_TABLET | ORAL | 3 refills | Status: DC

## 2017-05-08 DIAGNOSIS — M1711 Unilateral primary osteoarthritis, right knee: Secondary | ICD-10-CM | POA: Diagnosis not present

## 2017-05-08 DIAGNOSIS — M25561 Pain in right knee: Secondary | ICD-10-CM | POA: Diagnosis not present

## 2017-05-12 ENCOUNTER — Encounter: Payer: Self-pay | Admitting: Internal Medicine

## 2017-05-12 ENCOUNTER — Ambulatory Visit (INDEPENDENT_AMBULATORY_CARE_PROVIDER_SITE_OTHER): Payer: PPO | Admitting: Internal Medicine

## 2017-05-12 VITALS — BP 150/90 | HR 69 | Temp 97.9°F | Ht 61.5 in | Wt 156.0 lb

## 2017-05-12 DIAGNOSIS — J029 Acute pharyngitis, unspecified: Secondary | ICD-10-CM | POA: Diagnosis not present

## 2017-05-12 DIAGNOSIS — J0100 Acute maxillary sinusitis, unspecified: Secondary | ICD-10-CM | POA: Diagnosis not present

## 2017-05-12 LAB — POCT RAPID STREP A (OFFICE): Rapid Strep A Screen: NEGATIVE

## 2017-05-12 MED ORDER — AZITHROMYCIN 250 MG PO TABS: ORAL_TABLET | ORAL | 0 refills | Status: DC

## 2017-05-12 MED ORDER — BENZONATATE 100 MG PO CAPS: 100.0000 mg | ORAL_CAPSULE | Freq: Three times a day (TID) | ORAL | 0 refills | Status: DC | PRN

## 2017-05-12 NOTE — Patient Instructions (Addendum)
Zithromax Z-Pak take 2 tablets day 1 followed by 1 tablet days 2 through 5.  Tessalon Perles as needed for cough during the daytime up to 3 times daily.  Hycodan at night for cough.  Rest and drink plenty of fluids.  See dentist regarding tooth issue.

## 2017-05-12 NOTE — Progress Notes (Signed)
   Subjective:    Patient ID: Sally Hopkins, female    DOB: 1950/07/15, 67 y.o.   MRN: [id number]  HPI Husband has had issues with sinusitis for a week or 2 prior to her onset of her symptoms.  On Thursday she began to have a scratchy throat which progressed to.  Has been coughing up discolored sputum.  No discolored nasal drainage but sounds nasally congested.  Noticed pustule on her left posterior pharynx.  She thinks she may have an abscessed tooth and is seeing dentist later today.  Has maxillary sinus pressure.  Has headache.   Review of Systems see above     Objective:   Physical Exam Looks fatigued.  Skin warm and dry.  Has pustule left posterior pharynx.  Rapid strep screen is negative.  Pharynx is injected.  Neck supple.  Chest clear.       Assessment & Plan:  Acute maxillary sinusitis  Acute pharyngitis  Plan: Zithromax Z-Pak take 2 tablets day 1 followed by 1 tablet days 2 through 5.  Tessalon Perles 1 p.o. 3 times daily as needed for cough during the day and may take Hycodan which she has on hand at home at nighttime.  Rest and drink plenty of fluids and follow-up with dentist today.

## 2017-06-18 DIAGNOSIS — D2371 Other benign neoplasm of skin of right lower limb, including hip: Secondary | ICD-10-CM | POA: Diagnosis not present

## 2017-06-18 DIAGNOSIS — D2262 Melanocytic nevi of left upper limb, including shoulder: Secondary | ICD-10-CM | POA: Diagnosis not present

## 2017-06-18 DIAGNOSIS — D2261 Melanocytic nevi of right upper limb, including shoulder: Secondary | ICD-10-CM | POA: Diagnosis not present

## 2017-06-18 DIAGNOSIS — D485 Neoplasm of uncertain behavior of skin: Secondary | ICD-10-CM | POA: Diagnosis not present

## 2017-06-18 DIAGNOSIS — L821 Other seborrheic keratosis: Secondary | ICD-10-CM | POA: Diagnosis not present

## 2017-06-18 DIAGNOSIS — L570 Actinic keratosis: Secondary | ICD-10-CM | POA: Diagnosis not present

## 2017-06-18 DIAGNOSIS — C44321 Squamous cell carcinoma of skin of nose: Secondary | ICD-10-CM | POA: Diagnosis not present

## 2017-06-18 DIAGNOSIS — D225 Melanocytic nevi of trunk: Secondary | ICD-10-CM | POA: Diagnosis not present

## 2017-06-18 DIAGNOSIS — D1801 Hemangioma of skin and subcutaneous tissue: Secondary | ICD-10-CM | POA: Diagnosis not present

## 2017-07-08 ENCOUNTER — Other Ambulatory Visit: Payer: Self-pay | Admitting: Allergy and Immunology

## 2017-09-17 DIAGNOSIS — M25562 Pain in left knee: Secondary | ICD-10-CM | POA: Diagnosis not present

## 2017-09-17 DIAGNOSIS — M25561 Pain in right knee: Secondary | ICD-10-CM | POA: Diagnosis not present

## 2017-09-17 DIAGNOSIS — M1711 Unilateral primary osteoarthritis, right knee: Secondary | ICD-10-CM | POA: Diagnosis not present

## 2017-10-07 ENCOUNTER — Other Ambulatory Visit: Payer: Self-pay | Admitting: Internal Medicine

## 2017-10-07 ENCOUNTER — Other Ambulatory Visit: Payer: Self-pay | Admitting: Allergy and Immunology

## 2017-11-03 ENCOUNTER — Other Ambulatory Visit: Payer: PPO | Admitting: Internal Medicine

## 2017-11-03 DIAGNOSIS — R7309 Other abnormal glucose: Secondary | ICD-10-CM

## 2017-11-03 DIAGNOSIS — E7849 Other hyperlipidemia: Secondary | ICD-10-CM

## 2017-11-03 DIAGNOSIS — I10 Essential (primary) hypertension: Secondary | ICD-10-CM

## 2017-11-03 DIAGNOSIS — Z0000 Encounter for general adult medical examination without abnormal findings: Secondary | ICD-10-CM

## 2017-11-03 DIAGNOSIS — R7989 Other specified abnormal findings of blood chemistry: Secondary | ICD-10-CM

## 2017-11-04 ENCOUNTER — Ambulatory Visit (INDEPENDENT_AMBULATORY_CARE_PROVIDER_SITE_OTHER): Payer: PPO | Admitting: Internal Medicine

## 2017-11-04 ENCOUNTER — Other Ambulatory Visit: Payer: Self-pay

## 2017-11-04 ENCOUNTER — Encounter: Payer: Self-pay | Admitting: Internal Medicine

## 2017-11-04 VITALS — BP 118/76 | HR 76 | Ht 61.5 in | Wt 156.0 lb

## 2017-11-04 DIAGNOSIS — E7849 Other hyperlipidemia: Secondary | ICD-10-CM | POA: Diagnosis not present

## 2017-11-04 DIAGNOSIS — L500 Allergic urticaria: Secondary | ICD-10-CM

## 2017-11-04 DIAGNOSIS — I10 Essential (primary) hypertension: Secondary | ICD-10-CM | POA: Diagnosis not present

## 2017-11-04 DIAGNOSIS — B009 Herpesviral infection, unspecified: Secondary | ICD-10-CM | POA: Diagnosis not present

## 2017-11-04 DIAGNOSIS — Z0000 Encounter for general adult medical examination without abnormal findings: Secondary | ICD-10-CM

## 2017-11-04 DIAGNOSIS — Z87898 Personal history of other specified conditions: Secondary | ICD-10-CM

## 2017-11-04 DIAGNOSIS — B977 Papillomavirus as the cause of diseases classified elsewhere: Secondary | ICD-10-CM | POA: Insufficient documentation

## 2017-11-04 DIAGNOSIS — F411 Generalized anxiety disorder: Secondary | ICD-10-CM

## 2017-11-04 DIAGNOSIS — Z8619 Personal history of other infectious and parasitic diseases: Secondary | ICD-10-CM

## 2017-11-04 DIAGNOSIS — H8109 Meniere's disease, unspecified ear: Secondary | ICD-10-CM | POA: Diagnosis not present

## 2017-11-04 DIAGNOSIS — Z800 Family history of malignant neoplasm of digestive organs: Secondary | ICD-10-CM

## 2017-11-04 LAB — COMPLETE METABOLIC PANEL WITH GFR
AG Ratio: 1.6 (calc) (ref 1.0–2.5)
ALBUMIN MSPROF: 4.1 g/dL (ref 3.6–5.1)
ALT: 20 U/L (ref 6–29)
AST: 20 U/L (ref 10–35)
Alkaline phosphatase (APISO): 42 U/L (ref 33–130)
BUN: 24 mg/dL (ref 7–25)
CALCIUM: 9.6 mg/dL (ref 8.6–10.4)
CO2: 27 mmol/L (ref 20–32)
CREATININE: 0.84 mg/dL (ref 0.50–0.99)
Chloride: 103 mmol/L (ref 98–110)
GFR, EST AFRICAN AMERICAN: 84 mL/min/{1.73_m2} (ref >=60)
GFR, EST NON AFRICAN AMERICAN: 72 mL/min/{1.73_m2} (ref >=60)
GLUCOSE: 100 mg/dL — AB (ref 65–99)
Globulin: 2.6 g/dL (calc) (ref 1.9–3.7)
Potassium: 4 mmol/L (ref 3.5–5.3)
Sodium: 139 mmol/L (ref 135–146)
TOTAL PROTEIN: 6.7 g/dL (ref 6.1–8.1)
Total Bilirubin: 0.6 mg/dL (ref 0.2–1.2)

## 2017-11-04 LAB — TSH: TSH: 2.57 mIU/L (ref 0.40–4.50)

## 2017-11-04 LAB — HEMOGLOBIN A1C
Hgb A1c MFr Bld: 5.6 % of total Hgb (ref <5.7)
MEAN PLASMA GLUCOSE: 114 (calc)
eAG (mmol/L): 6.3 (calc)

## 2017-11-04 LAB — LIPID PANEL
CHOL/HDL RATIO: 3.4 (calc) (ref <5.0)
Cholesterol: 198 mg/dL (ref <200)
HDL: 58 mg/dL (ref >50)
LDL Cholesterol (Calc): 118 mg/dL (calc) — ABNORMAL HIGH
Non-HDL Cholesterol (Calc): 140 mg/dL (calc) — ABNORMAL HIGH (ref <130)
Triglycerides: 112 mg/dL (ref <150)

## 2017-11-04 LAB — CBC WITH DIFFERENTIAL/PLATELET
BASOS PCT: 0.8 %
Basophils Absolute: 38 cells/uL (ref 0–200)
EOS PCT: 5 %
Eosinophils Absolute: 240 cells/uL (ref 15–500)
HCT: 38.5 % (ref 35.0–45.0)
HEMOGLOBIN: 13.3 g/dL (ref 11.7–15.5)
LYMPHS ABS: 1277 {cells}/uL (ref 850–3900)
MCH: 30.4 pg (ref 27.0–33.0)
MCHC: 34.5 g/dL (ref 32.0–36.0)
MCV: 88.1 fL (ref 80.0–100.0)
MONOS PCT: 9 %
MPV: 10.6 fL (ref 7.5–12.5)
NEUTROS ABS: 2813 {cells}/uL (ref 1500–7800)
Neutrophils Relative %: 58.6 %
Platelets: 238 10*3/uL (ref 140–400)
RBC: 4.37 10*6/uL (ref 3.80–5.10)
RDW: 12.6 % (ref 11.0–15.0)
Total Lymphocyte: 26.6 %
WBC mixed population: 432 cells/uL (ref 200–950)
WBC: 4.8 10*3/uL (ref 3.8–10.8)

## 2017-11-04 MED ORDER — METOPROLOL SUCCINATE ER 50 MG PO TB24: ORAL_TABLET | ORAL | 3 refills | Status: DC

## 2017-11-04 MED ORDER — POTASSIUM CHLORIDE CRYS ER 20 MEQ PO TBCR: 20.0000 meq | EXTENDED_RELEASE_TABLET | Freq: Two times a day (BID) | ORAL | 3 refills | Status: DC

## 2017-11-04 MED ORDER — ALPRAZOLAM 0.25 MG PO TABS: 0.2500 mg | ORAL_TABLET | ORAL | 5 refills | Status: DC | PRN

## 2017-11-04 MED ORDER — SIMVASTATIN 10 MG PO TABS: 10.0000 mg | ORAL_TABLET | Freq: Every day | ORAL | 3 refills | Status: DC

## 2017-11-04 MED ORDER — LOSARTAN POTASSIUM 100 MG PO TABS: 100.0000 mg | ORAL_TABLET | Freq: Every day | ORAL | 3 refills | Status: DC

## 2017-11-04 NOTE — Patient Instructions (Addendum)
Xanax refilled.  Please return for pneumococcal 23 vaccine in the near future.  Please call [facility] regarding repeat colonoscopy.  Please have mammogram.

## 2017-11-04 NOTE — Progress Notes (Signed)
Subjective:    Patient ID: Sally Hopkins, female    DOB: 1950/03/08, 67 y.o.   MRN: [id number]  HPI 67 year old Female for health maintenance exam, Medicare wellness exam, and evaluation of medical issues. Had to have tooth extraction in May for cracked infected tooth.  Has not had tooth replaced yet.  History of hyperlipidemia, essential hypertension, allergic rhinitis, herpes simplex type II.  She takes potassium supplement due to hypokalemia from diuretic therapy.  Had flu vaccine recently at pharmacy and apparently had a local reaction with redness and soreness.  She realizes flu vaccine did not contain thimerosal which she says she has had allergic reaction to in the past.  Reminded regarding colonoscopy.  Last 10/27/2002.  History of Mnire's disease stable and in remission.  Had cryosurgery of cervix 2006.  Had endocervical biopsy March 2017 and again in September 2018 by Dr. Paula Compton.  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  No evidence of malignancy.  History of allergic urticaria and allergic rhinitis seen by Dr. Neldon Mc.  Social history: Married with 2 adult children.  Does not smoke.  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  Drinks 8 ounces of wine daily.  Graduate of [employer].  History of mildly elevated serum glucose.  Hemoglobin A1c has been followed and has been within normal limits.  Last elevation was 5.8% 3 years ago.  Family history: Father died at age 48 of ALS but also had colon cancer.  Mother with history of open heart surgery 1969 for congenital heart disease and also had CABG.  One sister in good health.     Review of Systems complaint of left arm soreness and erythema after recent flu vaccine at pharmacy     Objective:   Physical Exam  Constitutional: She is oriented to person, place, and time. She appears well-developed and well-nourished. No distress.  HENT:  Head: Normocephalic and atraumatic.  Right  Ear: External ear normal.  Left Ear: External ear normal.  Mouth/Throat: Oropharynx is clear and moist. No oropharyngeal exudate.  Eyes: Pupils are equal, round, and reactive to light. Right eye exhibits no discharge. Left eye exhibits no discharge.  Neck: Neck supple. No JVD present. No thyromegaly present.  Cardiovascular: Normal rate, regular rhythm, normal heart sounds and intact distal pulses.  No murmur heard. Pulmonary/Chest: Effort normal. No stridor. No respiratory distress. She has no wheezes. She has no rales.  Abdominal: She exhibits no distension and no mass. There is no tenderness. There is no rebound and no guarding.  Genitourinary:  Genitourinary Comments: Deferred to GYN  Musculoskeletal: She exhibits no edema.  Left upper arm tender and erythematous after flu vaccine  Lymphadenopathy:    She has no cervical adenopathy.  Neurological: She is oriented to person, place, and time. She displays normal reflexes. No cranial nerve deficit or sensory deficit. She exhibits normal muscle tone. Coordination normal.  Skin: Skin is warm and dry. She is not diaphoretic.  Psychiatric: She has a normal mood and affect. Her behavior is normal. Judgment and thought content normal.  Vitals reviewed.         Assessment & Plan:  Normal health maintenance exam  Local reaction to recent flu vaccine  Allergy to thimerosal  Family history of colon cancer-needs repeat colonoscopy  Health maintenance-needs mammogram  Health maintenance-needs pneumococcal 23 and will return next week when left arm has recovered from recent flu vaccine  History of HPV and abnormal  Pap smear status post cervical biopsy by Dr. Marvel Plan  Allergic urticaria  Allergic rhinitis-has been seen by allergist  Essential hypertension stable on current regimen  Elevated LDL cholesterol-continue Zocor 10 mg daily and watch diet.  Eat out last week at [employer]  Remote history of impaired glucose  tolerance-hemoglobin A1c 5.6%  Hypokalemia secondary to diuretic therapy  History of Mnire's disease  Herpes simplex treated with Valtrex as needed  Plan: Continue to watch diet and exercise.  Return in 1 year or as needed.  Return next week for pneumococcal 23 vaccine.  Xanax refilled.  Subjective:   Patient presents for Medicare Annual/Subsequent preventive examination.  Review Past Medical/Family/Social: See above   Risk Factors  Current exercise habits: Fairly physically active Dietary issues discussed: Low-fat low carbohydrate  Cardiac risk factors: Hyperlipidemia, family history in mother  Depression Screen  (Note: if answer to either of the following is "Yes", a more complete depression screening is indicated)   Over the past two weeks, have you felt down, depressed or hopeless? No  Over the past two weeks, have you felt little interest or pleasure in doing things? No Have you lost interest or pleasure in daily life? No Do you often feel hopeless? No Do you cry easily over simple problems? No   Activities of Daily Living  In your present state of health, do you have any difficulty performing the following activities?:   Driving? No  Managing money? No  Feeding yourself? No  Getting from bed to chair? No  Climbing a flight of stairs? No  Preparing food and eating?: No  Bathing or showering? No  Getting dressed: No  Getting to the toilet? No  Using the toilet:No  Moving around from place to place: No  In the past year have you fallen or had a near fall?:No  Are you sexually active? No  Do you have more than one partner? No   Hearing Difficulties: Sometimes Do you often ask people to speak up or repeat themselves?  Yes Do you experience ringing or noises in your ears?  Yes Do you have difficulty understanding soft or whispered voices?  Yes Do you feel that you have a problem with memory? No Do you often misplace items? No    Home Safety:  Do you have a  smoke alarm at your residence? Yes Do you have grab bars in the bathroom?  No Do you have throw rugs in your house?  No   Cognitive Testing  Alert? Yes Normal Appearance?Yes  Oriented to person? Yes Place? Yes  Time? Yes  Recall of three objects? Yes  Can perform simple calculations? Yes  Displays appropriate judgment?Yes  Can read the correct time from a watch face?Yes   List the Names of Other Physician/Practitioners you currently use:  See referral list for the physicians patient is currently seeing.     Review of Systems: See above   Objective:     General appearance: Appears younger than stated age Head: Normocephalic, without obvious abnormality, atraumatic  Eyes: conj clear, EOMi PEERLA  Ears: normal TM's and external ear canals both ears  Nose: Nares normal. Septum midline. Mucosa normal. No drainage or sinus tenderness.  Throat: lips, mucosa, and tongue normal; teeth and gums normal  Neck: no adenopathy, no carotid bruit, no JVD, supple, symmetrical, trachea midline and thyroid not enlarged, symmetric, no tenderness/mass/nodules  No CVA tenderness.  Lungs: clear to auscultation bilaterally  Breasts: normal appearance, no masses or tenderness Heart: regular  rate and rhythm, S1, S2 normal, no murmur, click, rub or gallop  Abdomen: soft, non-tender; bowel sounds normal; no masses, no organomegaly  Musculoskeletal: ROM normal in all joints, no crepitus, no deformity, Normal muscle strengthen. Back  is symmetric, no curvature. Skin: Skin color, texture, turgor normal. No rashes or lesions  Lymph nodes: Cervical, supraclavicular, and axillary nodes normal.  Neurologic: CN 2 -12 Normal, Normal symmetric reflexes. Normal coordination and gait  Psych: Alert & Oriented x 3, Mood appear stable.    Assessment:    Annual wellness medicare exam   Plan:    During the course of the visit the patient was educated and counseled about appropriate screening and preventive  services including:   Annual mammogram  Colonoscopy  Flu vaccine  Return for pneumococcal 23     Patient Instructions (the written plan) was given to the patient.  Medicare Attestation  I have personally reviewed:  The patient's medical and social history  Their use of alcohol, tobacco or illicit drugs  Their current medications and supplements  The patient's functional ability including ADLs,fall risks, home safety risks, cognitive, and hearing and visual impairment  Diet and physical activities  Evidence for depression or mood disorders  The patient's weight, height, BMI, and visual acuity have been recorded in the chart. I have made referrals, counseling, and provided education to the patient based on review of the above and I have provided the patient with a written personalized care plan for preventive services.

## 2017-11-05 LAB — POCT URINALYSIS DIPSTICK
Appearance: NORMAL
BILIRUBIN UA: NEGATIVE
Blood, UA: NEGATIVE
GLUCOSE UA: NEGATIVE
Ketones, UA: NEGATIVE
LEUKOCYTES UA: NEGATIVE
Nitrite, UA: NEGATIVE
Odor: NORMAL
Protein, UA: NEGATIVE
SPEC GRAV UA: 1.015 (ref 1.010–1.025)
Urobilinogen, UA: 0.2 E.U./dL
pH, UA: 6 (ref 5.0–8.0)

## 2017-12-10 ENCOUNTER — Encounter: Payer: Self-pay | Admitting: Internal Medicine

## 2017-12-10 DIAGNOSIS — Z1231 Encounter for screening mammogram for malignant neoplasm of breast: Secondary | ICD-10-CM | POA: Diagnosis not present

## 2017-12-17 DIAGNOSIS — M25561 Pain in right knee: Secondary | ICD-10-CM | POA: Diagnosis not present

## 2017-12-17 DIAGNOSIS — M1711 Unilateral primary osteoarthritis, right knee: Secondary | ICD-10-CM | POA: Diagnosis not present

## 2018-01-05 ENCOUNTER — Other Ambulatory Visit: Payer: Self-pay | Admitting: Allergy and Immunology

## 2018-01-05 ENCOUNTER — Other Ambulatory Visit: Payer: Self-pay | Admitting: Internal Medicine

## 2018-02-25 DIAGNOSIS — C4441 Basal cell carcinoma of skin of scalp and neck: Secondary | ICD-10-CM | POA: Diagnosis not present

## 2018-02-25 DIAGNOSIS — D485 Neoplasm of uncertain behavior of skin: Secondary | ICD-10-CM | POA: Diagnosis not present

## 2018-02-25 DIAGNOSIS — Z85828 Personal history of other malignant neoplasm of skin: Secondary | ICD-10-CM | POA: Diagnosis not present

## 2018-02-25 DIAGNOSIS — D2371 Other benign neoplasm of skin of right lower limb, including hip: Secondary | ICD-10-CM | POA: Diagnosis not present

## 2018-03-19 ENCOUNTER — Encounter: Payer: Self-pay | Admitting: Internal Medicine

## 2018-03-19 DIAGNOSIS — C4441 Basal cell carcinoma of skin of scalp and neck: Secondary | ICD-10-CM | POA: Diagnosis not present

## 2018-03-19 DIAGNOSIS — L988 Other specified disorders of the skin and subcutaneous tissue: Secondary | ICD-10-CM | POA: Diagnosis not present

## 2018-03-19 DIAGNOSIS — Z85828 Personal history of other malignant neoplasm of skin: Secondary | ICD-10-CM | POA: Diagnosis not present

## 2018-04-03 ENCOUNTER — Other Ambulatory Visit: Payer: Self-pay | Admitting: Internal Medicine

## 2018-07-02 ENCOUNTER — Other Ambulatory Visit: Payer: Self-pay | Admitting: Internal Medicine

## 2018-07-09 ENCOUNTER — Other Ambulatory Visit: Payer: Self-pay | Admitting: Internal Medicine

## 2018-09-23 ENCOUNTER — Other Ambulatory Visit: Payer: Self-pay | Admitting: Cardiology

## 2018-09-23 DIAGNOSIS — R6889 Other general symptoms and signs: Secondary | ICD-10-CM | POA: Diagnosis not present

## 2018-09-23 DIAGNOSIS — Z20822 Contact with and (suspected) exposure to covid-19: Secondary | ICD-10-CM

## 2018-09-24 LAB — NOVEL CORONAVIRUS, NAA: SARS-CoV-2, NAA: NOT DETECTED

## 2018-09-30 ENCOUNTER — Other Ambulatory Visit: Payer: Self-pay | Admitting: Internal Medicine

## 2018-10-09 ENCOUNTER — Other Ambulatory Visit: Payer: Self-pay

## 2018-10-09 DIAGNOSIS — Z20822 Contact with and (suspected) exposure to covid-19: Secondary | ICD-10-CM

## 2018-10-10 LAB — NOVEL CORONAVIRUS, NAA: SARS-CoV-2, NAA: NOT DETECTED

## 2018-10-20 DIAGNOSIS — D2262 Melanocytic nevi of left upper limb, including shoulder: Secondary | ICD-10-CM | POA: Diagnosis not present

## 2018-10-20 DIAGNOSIS — L819 Disorder of pigmentation, unspecified: Secondary | ICD-10-CM | POA: Diagnosis not present

## 2018-10-20 DIAGNOSIS — L821 Other seborrheic keratosis: Secondary | ICD-10-CM | POA: Diagnosis not present

## 2018-10-20 DIAGNOSIS — Z85828 Personal history of other malignant neoplasm of skin: Secondary | ICD-10-CM | POA: Diagnosis not present

## 2018-10-20 DIAGNOSIS — L814 Other melanin hyperpigmentation: Secondary | ICD-10-CM | POA: Diagnosis not present

## 2018-10-20 DIAGNOSIS — D225 Melanocytic nevi of trunk: Secondary | ICD-10-CM | POA: Diagnosis not present

## 2018-10-20 DIAGNOSIS — D2371 Other benign neoplasm of skin of right lower limb, including hip: Secondary | ICD-10-CM | POA: Diagnosis not present

## 2018-10-20 DIAGNOSIS — D2261 Melanocytic nevi of right upper limb, including shoulder: Secondary | ICD-10-CM | POA: Diagnosis not present

## 2018-11-05 ENCOUNTER — Other Ambulatory Visit: Payer: PPO | Admitting: Internal Medicine

## 2018-11-05 ENCOUNTER — Other Ambulatory Visit: Payer: Self-pay

## 2018-11-05 DIAGNOSIS — Z800 Family history of malignant neoplasm of digestive organs: Secondary | ICD-10-CM

## 2018-11-05 DIAGNOSIS — B009 Herpesviral infection, unspecified: Secondary | ICD-10-CM

## 2018-11-05 DIAGNOSIS — I10 Essential (primary) hypertension: Secondary | ICD-10-CM | POA: Diagnosis not present

## 2018-11-05 DIAGNOSIS — E7849 Other hyperlipidemia: Secondary | ICD-10-CM | POA: Diagnosis not present

## 2018-11-05 DIAGNOSIS — Z0000 Encounter for general adult medical examination without abnormal findings: Secondary | ICD-10-CM | POA: Diagnosis not present

## 2018-11-05 DIAGNOSIS — H8109 Meniere's disease, unspecified ear: Secondary | ICD-10-CM

## 2018-11-05 DIAGNOSIS — Z87898 Personal history of other specified conditions: Secondary | ICD-10-CM

## 2018-11-05 DIAGNOSIS — R7309 Other abnormal glucose: Secondary | ICD-10-CM | POA: Diagnosis not present

## 2018-11-06 ENCOUNTER — Encounter: Payer: Self-pay | Admitting: Internal Medicine

## 2018-11-06 ENCOUNTER — Ambulatory Visit (INDEPENDENT_AMBULATORY_CARE_PROVIDER_SITE_OTHER): Payer: PPO | Admitting: Internal Medicine

## 2018-11-06 VITALS — BP 130/78 | HR 81 | Temp 98.0°F | Ht 62.0 in | Wt 158.0 lb

## 2018-11-06 DIAGNOSIS — Z8619 Personal history of other infectious and parasitic diseases: Secondary | ICD-10-CM

## 2018-11-06 DIAGNOSIS — I10 Essential (primary) hypertension: Secondary | ICD-10-CM | POA: Diagnosis not present

## 2018-11-06 DIAGNOSIS — B009 Herpesviral infection, unspecified: Secondary | ICD-10-CM

## 2018-11-06 DIAGNOSIS — Z0000 Encounter for general adult medical examination without abnormal findings: Secondary | ICD-10-CM

## 2018-11-06 DIAGNOSIS — Z23 Encounter for immunization: Secondary | ICD-10-CM | POA: Diagnosis not present

## 2018-11-06 DIAGNOSIS — Z800 Family history of malignant neoplasm of digestive organs: Secondary | ICD-10-CM | POA: Diagnosis not present

## 2018-11-06 DIAGNOSIS — H8109 Meniere's disease, unspecified ear: Secondary | ICD-10-CM | POA: Diagnosis not present

## 2018-11-06 DIAGNOSIS — E782 Mixed hyperlipidemia: Secondary | ICD-10-CM

## 2018-11-06 DIAGNOSIS — F411 Generalized anxiety disorder: Secondary | ICD-10-CM | POA: Diagnosis not present

## 2018-11-06 DIAGNOSIS — L500 Allergic urticaria: Secondary | ICD-10-CM

## 2018-11-06 LAB — POCT URINALYSIS DIPSTICK
Appearance: NEGATIVE
Bilirubin, UA: NEGATIVE
Blood, UA: NEGATIVE
Glucose, UA: NEGATIVE
Ketones, UA: NEGATIVE
Leukocytes, UA: NEGATIVE
Nitrite, UA: NEGATIVE
Odor: NEGATIVE
Protein, UA: NEGATIVE
Spec Grav, UA: 1.01
Urobilinogen, UA: 0.2 U/dL
pH, UA: 6.5

## 2018-11-06 NOTE — Patient Instructions (Signed)
Patient would like to postpone colonoscopy at this time.  Given pneumococcal 23 vaccine.  Continue current medications and follow-up in 1 year or as needed.

## 2018-11-06 NOTE — Progress Notes (Signed)
Subjective:    Patient ID: Sally Hopkins, female    DOB: 02-25-1950, 68 y.o.   MRN: [id number]  HPI  68 year old Female for Medicare wellness, health maintenance exam and evaluation of medical issues.  History of hyperlipidemia, hypertension, allergic rhinitis, herpes simplex type II.  She takes potassium supplement due to hypokalemia from diuretic therapy.  History of Mnire's disease stable and in remission.  Had cryosurgery of the cervix 2006.  Endocervical biopsy March 2017 and again in September 2018 by Dr. Paula Compton.  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  No evidence of malignancy.  History of allergic urticaria and allergic rhinitis seen by Dr. Neldon Mc.  History of mildly elevated serum glucose but normal hemoglobin A1c.  Last elevated hemoglobin A1c was 5.8% 4 years ago.  Social history: She is married with 2 adult children.  Does not smoke.  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  Graduated from [employer]  Social alcohol consumption.  Family history: Father died at age 45 of ALS but also had colon cancer.  Mother with history of open heart surgery 1969 for congenital heart disease and also had CABG.  1 sister in good health.  Elevated serum glucose so will add Hgb AIC. Declines colonoscopy Pneumococcal 23 vaccine given today.     Review of Systems  Constitutional: Negative.   Respiratory: Negative.   Cardiovascular: Negative.   Gastrointestinal: Negative.   Genitourinary: Negative.   Psychiatric/Behavioral: Negative.        Objective:   Physical Exam Vitals signs reviewed.  Constitutional:      Appearance: Normal appearance. She is not diaphoretic.  HENT:     Head: Normocephalic and atraumatic.     Right Ear: Tympanic membrane normal.     Left Ear: Tympanic membrane normal.     Nose: Nose normal.  Eyes:     General: No scleral icterus.    Extraocular Movements: Extraocular movements intact.   Conjunctiva/sclera: Conjunctivae normal.     Pupils: Pupils are equal, round, and reactive to light.  Neck:     Musculoskeletal: Neck supple.     Vascular: No carotid bruit.     Comments: No thyromegaly Cardiovascular:     Rate and Rhythm: Normal rate and regular rhythm.     Pulses: Normal pulses.     Heart sounds: Normal heart sounds. No murmur.     Comments: Breast without masses Pulmonary:     Effort: Pulmonary effort is normal.     Breath sounds: Normal breath sounds. No wheezing or rales.  Abdominal:     General: Abdomen is flat. Bowel sounds are normal. There is no distension.     Palpations: Abdomen is soft. There is no mass.     Tenderness: There is no guarding or rebound.  Genitourinary:    Comments: Deferred to GYN Musculoskeletal:        General: No deformity.     Right lower leg: No edema.     Left lower leg: No edema.  Lymphadenopathy:     Cervical: No cervical adenopathy.  Skin:    General: Skin is warm and dry.     Findings: No lesion or rash.  Neurological:     General: No focal deficit present.     Mental Status: She is alert and oriented to person, place, and time.     Cranial Nerves: No cranial nerve deficit.     Sensory: No sensory deficit.  Motor: No weakness.     Coordination: Coordination normal.  Psychiatric:        Mood and Affect: Mood normal.        Behavior: Behavior normal.        Thought Content: Thought content normal.        Judgment: Judgment normal.    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  Has elevated serum glucose but has had normal hemoglobin A1c over the past few years  Hypokalemia secondary to diuretic therapy  History of Mnire's disease  Plan: Continue to watch diet and exercise.  Return in 1 year or as needed.  Okay to refill medications if pharmacy calls.  Subjective:   Patient presents for Medicare Annual/Subsequent preventive examination.  Review Past Medical/Family/Social: See above   Risk Factors  Current exercise habits: Regular exercise Dietary issues discussed: Low-fat low carbohydrate  Cardiac risk factors:  Depression Screen  (Note: if answer to either of the following is "Yes", a more complete depression screening is indicated)   Over the past two weeks, have you felt down, depressed or hopeless?  Yes due to the pandemic-tired of it Over the past two weeks, have you felt little interest or pleasure in doing things? No Have you lost interest or pleasure in daily life? No Do you often feel hopeless? No Do you cry easily over simple problems? No   Activities of Daily Living  In your present state of health, do you have any difficulty performing the following activities?:   Driving? No  Managing money? No  Feeding yourself? No  Getting from bed to chair? No  Climbing a flight of stairs? No  Preparing food and eating?: No  Bathing or showering? No  Getting dressed: No  Getting to the toilet? No  Using the toilet:No  Moving around from place to place: No  In the past year have you fallen or had a near fall?:No  Are you sexually active?  Seldom Do you have more than one partner? No   Hearing Difficulties:  Do you often ask people to speak up or repeat themselves?  Yes Do you experience ringing or noises in your ears?  Yes due to history of Mnire's disease Do you have difficulty understanding soft or whispered voices?  Yes Do you feel that you have a problem with memory? No Do you often  misplace items? No    Home Safety:  Do you have a smoke alarm at your residence? Yes Do you have grab bars in the bathroom?  None Do you have throw rugs in your house?  Small him   Cognitive Testing  Alert? Yes Normal Appearance?Yes  Oriented to person? Yes Place? Yes  Time? Yes  Recall of three objects? Yes  Can perform simple calculations? Yes  Displays appropriate judgment?Yes  Can read the correct time from a watch face?Yes   List the Names of Other Physician/Practitioners you currently use:  See referral list for the physicians patient is currently seeing.     Review of  Systems: See above  Objective:     General appearance: Appears younger than stated age Head: Normocephalic, without obvious abnormality, atraumatic  Eyes: conj clear, EOMi PEERLA  Ears: normal TM's and external ear canals both ears  Nose: Nares normal. Septum midline. Mucosa normal. No drainage or sinus tenderness.  Throat: lips, mucosa, and tongue normal; teeth and gums normal  Neck: no adenopathy, no carotid bruit, no JVD, supple, symmetrical, trachea midline and thyroid not enlarged, symmetric, no tenderness/mass/nodules  No CVA tenderness.  Lungs: clear to auscultation bilaterally  Breasts: normal appearance, no masses or tenderness, top of the pacemaker on left upper chest. Incision well-healed. It is tender.  Heart: regular rate and rhythm, S1, S2 normal, no murmur, click, rub or gallop  Abdomen: soft, non-tender; bowel sounds normal; no masses, no organomegaly  Musculoskeletal: ROM normal in all joints, no crepitus, no deformity, Normal muscle strengthen. Back  is symmetric, no curvature. Skin: Skin color, texture, turgor normal. No rashes or lesions  Lymph nodes: Cervical, supraclavicular, and axillary nodes normal.  Neurologic: CN 2 -12 Normal, Normal symmetric reflexes. Normal coordination and gait  Psych: Alert & Oriented x 3, Mood appear stable.    Assessment:    Annual wellness  medicare exam   Plan:    During the course of the visit the patient was educated and counseled about appropriate screening and preventive services including:   Annual mammogram  Annual flu vaccine received September 8  Pneumococcal 23 given today     Patient Instructions (the written plan) was given to the patient.  Medicare Attestation  I have personally reviewed:  The patient's medical and social history  Their use of alcohol, tobacco or illicit drugs  Their current medications and supplements  The patient's functional ability including ADLs,fall risks, home safety risks, cognitive, and hearing and visual impairment  Diet and physical activities  Evidence for depression or mood disorders  The patient's weight, height, BMI, and visual acuity have been recorded in the chart. I have made referrals, counseling, and provided education to the patient based on review of the above and I have provided the patient with a written personalized care plan for preventive services.

## 2018-11-09 LAB — CBC WITH DIFFERENTIAL/PLATELET
Absolute Monocytes: 460 cells/uL (ref 200–950)
Basophils Absolute: 50 cells/uL (ref 0–200)
Basophils Relative: 1 %
Eosinophils Absolute: 270 cells/uL (ref 15–500)
Eosinophils Relative: 5.4 %
HCT: 37.6 % (ref 35.0–45.0)
Hemoglobin: 12.9 g/dL (ref 11.7–15.5)
Lymphs Abs: 1595 cells/uL (ref 850–3900)
MCH: 32.3 pg (ref 27.0–33.0)
MCHC: 34.3 g/dL (ref 32.0–36.0)
MCV: 94 fL (ref 80.0–100.0)
MPV: 10.8 fL (ref 7.5–12.5)
Monocytes Relative: 9.2 %
Neutro Abs: 2625 cells/uL (ref 1500–7800)
Neutrophils Relative %: 52.5 %
Platelets: 209 10*3/uL (ref 140–400)
RBC: 4 10*6/uL (ref 3.80–5.10)
RDW: 12 % (ref 11.0–15.0)
Total Lymphocyte: 31.9 %
WBC: 5 10*3/uL (ref 3.8–10.8)

## 2018-11-09 LAB — COMPLETE METABOLIC PANEL WITH GFR
AG Ratio: 1.7 (calc) (ref 1.0–2.5)
ALT: 18 U/L (ref 6–29)
AST: 16 U/L (ref 10–35)
Albumin: 4.2 g/dL (ref 3.6–5.1)
Alkaline phosphatase (APISO): 41 U/L (ref 37–153)
BUN: 25 mg/dL (ref 7–25)
CO2: 23 mmol/L (ref 20–32)
Calcium: 9.7 mg/dL (ref 8.6–10.4)
Chloride: 104 mmol/L (ref 98–110)
Creat: 0.91 mg/dL (ref 0.50–0.99)
GFR, Est African American: 76 mL/min/{1.73_m2} (ref >=60)
GFR, Est Non African American: 65 mL/min/{1.73_m2} (ref >=60)
Globulin: 2.5 g/dL (calc) (ref 1.9–3.7)
Glucose, Bld: 111 mg/dL — ABNORMAL HIGH (ref 65–99)
Potassium: 3.7 mmol/L (ref 3.5–5.3)
Sodium: 137 mmol/L (ref 135–146)
Total Bilirubin: 0.7 mg/dL (ref 0.2–1.2)
Total Protein: 6.7 g/dL (ref 6.1–8.1)

## 2018-11-09 LAB — TEST AUTHORIZATION

## 2018-11-09 LAB — LIPID PANEL
Cholesterol: 223 mg/dL — ABNORMAL HIGH (ref <200)
HDL: 55 mg/dL (ref >=50)
LDL Cholesterol (Calc): 139 mg/dL (calc) — ABNORMAL HIGH
Non-HDL Cholesterol (Calc): 168 mg/dL (calc) — ABNORMAL HIGH (ref <130)
Total CHOL/HDL Ratio: 4.1 (calc) (ref <5.0)
Triglycerides: 158 mg/dL — ABNORMAL HIGH (ref <150)

## 2018-11-09 LAB — TSH: TSH: 3.84 mIU/L (ref 0.40–4.50)

## 2018-11-09 LAB — HEMOGLOBIN A1C W/OUT EAG: Hgb A1c MFr Bld: 5.2 % of total Hgb (ref <5.7)

## 2018-11-18 ENCOUNTER — Other Ambulatory Visit: Payer: Self-pay | Admitting: Internal Medicine

## 2018-11-30 ENCOUNTER — Other Ambulatory Visit: Payer: Self-pay

## 2018-11-30 DIAGNOSIS — Z20822 Contact with and (suspected) exposure to covid-19: Secondary | ICD-10-CM

## 2018-12-01 LAB — NOVEL CORONAVIRUS, NAA: SARS-CoV-2, NAA: NOT DETECTED

## 2018-12-16 DIAGNOSIS — Z1231 Encounter for screening mammogram for malignant neoplasm of breast: Secondary | ICD-10-CM | POA: Diagnosis not present

## 2018-12-16 LAB — HM MAMMOGRAPHY

## 2018-12-21 ENCOUNTER — Encounter: Payer: Self-pay | Admitting: Internal Medicine

## 2018-12-24 DIAGNOSIS — Z03818 Encounter for observation for suspected exposure to other biological agents ruled out: Secondary | ICD-10-CM | POA: Diagnosis not present

## 2018-12-24 DIAGNOSIS — Z9189 Other specified personal risk factors, not elsewhere classified: Secondary | ICD-10-CM | POA: Diagnosis not present

## 2018-12-24 DIAGNOSIS — R05 Cough: Secondary | ICD-10-CM | POA: Diagnosis not present

## 2018-12-28 ENCOUNTER — Other Ambulatory Visit: Payer: Self-pay | Admitting: Internal Medicine

## 2018-12-28 NOTE — Telephone Encounter (Signed)
Refill these. Had recent CPE comes yearly.

## 2019-01-13 DIAGNOSIS — Z20828 Contact with and (suspected) exposure to other viral communicable diseases: Secondary | ICD-10-CM | POA: Diagnosis not present

## 2019-01-15 ENCOUNTER — Encounter: Payer: Self-pay | Admitting: Internal Medicine

## 2019-01-21 ENCOUNTER — Ambulatory Visit: Payer: PPO | Attending: Internal Medicine

## 2019-01-21 DIAGNOSIS — Z20822 Contact with and (suspected) exposure to covid-19: Secondary | ICD-10-CM

## 2019-01-22 LAB — NOVEL CORONAVIRUS, NAA: SARS-CoV-2, NAA: NOT DETECTED

## 2019-01-26 ENCOUNTER — Ambulatory Visit: Payer: PPO | Attending: Internal Medicine

## 2019-01-26 DIAGNOSIS — Z23 Encounter for immunization: Secondary | ICD-10-CM

## 2019-01-26 NOTE — Progress Notes (Signed)
   Covid-19 Vaccination Clinic  Name:  Sally Hopkins    MRN: [id number] DOB: 1950-05-20  01/26/2019  Sally Hopkins was observed post Covid-19 immunization for 15 minutes without incidence. She was provided with Vaccine Information Sheet and instruction to access the V-Safe system.   Sally Hopkins was instructed to call 911 with any severe reactions post vaccine: Marland Kitchen Difficulty breathing  . Swelling of your face and throat  . A fast heartbeat  . A bad rash all over your body  . Dizziness and weakness    Immunizations Administered    Name Date Dose VIS Date Route   Pfizer COVID-19 Vaccine 01/26/2019  4:05 PM 0.3 mL 12/18/2018 Intramuscular   Manufacturer: [place]   Lot: [id number]   [place]: [id number]

## 2019-02-14 ENCOUNTER — Ambulatory Visit: Payer: PPO | Attending: Internal Medicine

## 2019-02-14 DIAGNOSIS — Z23 Encounter for immunization: Secondary | ICD-10-CM | POA: Insufficient documentation

## 2019-02-14 NOTE — Progress Notes (Signed)
   Covid-19 Vaccination Clinic  Name:  Sally Hopkins    MRN: [id number] DOB: 26-Aug-1950  02/14/2019  [name] was observed post Covid-19 immunization for 15 minutes without incidence. She was provided with Vaccine Information Sheet and instruction to access the V-Safe system.   [name] was instructed to call 911 with any severe reactions post vaccine: Marland Kitchen Difficulty breathing  . Swelling of your face and throat  . A fast heartbeat  . A bad rash all over your body  . Dizziness and weakness    Immunizations Administered    Name Date Dose VIS Date Route   Pfizer COVID-19 Vaccine 02/14/2019  9:59 AM 0.3 mL 12/18/2018 Intramuscular   Manufacturer: [place]   Lot: [id number]   [place]: [id number]

## 2019-02-16 ENCOUNTER — Ambulatory Visit: Payer: PPO

## 2019-02-19 ENCOUNTER — Ambulatory Visit: Payer: PPO | Attending: Internal Medicine

## 2019-02-19 ENCOUNTER — Telehealth: Payer: Self-pay | Admitting: Internal Medicine

## 2019-02-19 DIAGNOSIS — Z20822 Contact with and (suspected) exposure to covid-19: Secondary | ICD-10-CM

## 2019-02-19 DIAGNOSIS — R059 Cough, unspecified: Secondary | ICD-10-CM

## 2019-02-19 DIAGNOSIS — R05 Cough: Secondary | ICD-10-CM

## 2019-02-19 DIAGNOSIS — R509 Fever, unspecified: Secondary | ICD-10-CM

## 2019-02-19 NOTE — Telephone Encounter (Signed)
Called Sally Hopkins back to see if she had found the phone number in the meantime I had looked it up [phone], so I gave it to her and she was also going to go get a COVID test, to make sure she did not have COVID, she has had no exposure and she was also wanting to know if we had flu test here. I let her know we did, but would need to do virtual visit to discuss. She is going to call back after she speaks with the COVID adverse reaction people and after she goes for COVID test.

## 2019-02-19 NOTE — Telephone Encounter (Signed)
She should contact vaccine adverse reaction phone number and let us know what they say. I think it would be first 48 hours. Can do virtual visit if she wants

## 2019-02-19 NOTE — Telephone Encounter (Signed)
Sally Hopkins [phone]   [name] called to say she had her COVID - 49 vaccine on Sunday 02/21/19 and she had fever that night 101.7, head felt heavy the next day, by Wednesday she was feeling much better. Then last night she started having chills  And her head still feels heavy off and on. She wants to know if this could still be reaction from shot, should she go be tested for COVID or could this maybe be the flu. I did tell her to contact the number on the papers that she was giving when she got the shot.

## 2019-02-19 NOTE — Telephone Encounter (Signed)
Called Sally Hopkins back to let her know that Dr Renold Genta said to call vaccine adverse reaction phone number and to call us back after she speaks with them. She was going to look for phone number and call.

## 2019-02-21 LAB — NOVEL CORONAVIRUS, NAA: SARS-CoV-2, NAA: NOT DETECTED

## 2019-02-23 DIAGNOSIS — R509 Fever, unspecified: Secondary | ICD-10-CM | POA: Diagnosis not present

## 2019-03-01 ENCOUNTER — Encounter: Payer: Self-pay | Admitting: Internal Medicine

## 2019-03-01 ENCOUNTER — Ambulatory Visit
Admission: RE | Admit: 2019-03-01 | Discharge: 2019-03-01 | Disposition: A | Discharge status: Home / Self Care | Payer: PPO | Source: Ambulatory Visit | Attending: Internal Medicine | Admitting: Internal Medicine

## 2019-03-01 ENCOUNTER — Other Ambulatory Visit: Payer: Self-pay

## 2019-03-01 ENCOUNTER — Ambulatory Visit: Payer: PPO

## 2019-03-01 ENCOUNTER — Ambulatory Visit (INDEPENDENT_AMBULATORY_CARE_PROVIDER_SITE_OTHER): Payer: PPO | Admitting: Internal Medicine

## 2019-03-01 VITALS — BP 140/90 | HR 111 | Temp 97.7°F | Ht 62.0 in | Wt 151.0 lb

## 2019-03-01 DIAGNOSIS — R05 Cough: Secondary | ICD-10-CM

## 2019-03-01 DIAGNOSIS — R059 Cough, unspecified: Secondary | ICD-10-CM

## 2019-03-01 DIAGNOSIS — R509 Fever, unspecified: Secondary | ICD-10-CM

## 2019-03-01 DIAGNOSIS — J9811 Atelectasis: Secondary | ICD-10-CM | POA: Diagnosis not present

## 2019-03-01 DIAGNOSIS — J90 Pleural effusion, not elsewhere classified: Secondary | ICD-10-CM | POA: Diagnosis not present

## 2019-03-01 NOTE — Progress Notes (Signed)
Subjective:    Patient ID: Sally Hopkins, female    DOB: 03-12-1950, 69 y.o.   MRN: [id number]  HPI 69 year old Female seen acutely today after having had Covid-19 vaccines January 19 and February 7. Did well with first vaccine but day of second vaccine which was Super Bowl Sunday she developed fever up to 102 degrees,lack of energy, malaise and local reaction right arm which was red and warm to touch. 4 days later had malaise but no cough. Had temp 100.4 degrees and developed shaking chills. Has had swollen glans, pain along both clavicles and last Wednesday night had some right ear pain. No vomiting, headache or dysguesia.  She has allergies to thimerosol,PCN,Sulfa, Macrobid,Erythromycin  Husband had Covid-19 in January but she did not become ill then. She tested negative then at [facility]. He received Infusion therapy.  She was tested January 14 and was negative for Covid-19. Also, tested negative on February 12.  Medical history: Mood history of Mnire's disease which has been stable and in remission, hyperlipidemia, hypertension, allergic rhinitis, herpes simplex type II.  History of allergic urticaria and allergic rhinitis seen by Dr. Neldon Mc.  History of mild glucose intolerance some 40 years ago but recent values over the past 3 years have been normal.  In October 2019 she reported local reaction to flu vaccine with redness and soreness however there was no thimerosal in that vaccine.  Social history: Married with 2 adult children.  Does not smoke.  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  Social alcohol consumption.  Family history: Father died at age 47 of ALS but also had colon cancer.  Mother with history of open heart surgery 1969 for congenital heart disease and also had CABG.  1 sister in good health.  History of basal cell carcinoma spring 2020  Received pneumococcal 23 vaccine in October 2020  Review of Systems malaise, Pain in clavicle areas  bilateral. Denies SOB, some headache, no N,V,D. Denies chest pain.  All of this has made patient anxious and concerned.  She had a chest x-ray prior to coming to the office today which showed enlargement of cardiac silhouette with pulmonary vascularity normal.  Subsegmental atelectasis noted left lung with tiny left pleural effusion.  Minimal atelectasis right base.  No definite infiltrate or pneumothorax.     Objective:   Physical Exam Blood pressure 140/90, pulse 111 and regular pulse oximetry 97% Skin warm and dry.  Nodes none.  Neck is supple.  Chest is clear to auscultation without rales or wheezing.  Cardiac exam: Tachycardia regular rate and rhythm.  No ectopy.  Some mild tenderness along both clavicles.  No hepatosplenomegaly.  No lower extremity edema.  Affect is anxious.      Assessment & Plan:  It is not clear to me if patient has had reaction to COVID-19 vaccine but she has had adverse reactions to immunizations in the past.  She has allergy to thimerosal with the vaccines do not contain thimerosal any more.  She had local reaction to flu vaccine in 2019.  History of allergic rhinitis and allergic urticaria.  The chest x-ray findings are somewhat concerning.  I would like to try to get CT of the chest to evaluate further.  I also want to rule out myocarditis.  EKG does not show evidence of pericarditis or acute MI.  Does have family history of heart disease in her mother.  I am going to refer her to Cardiology.  I  would like to see if we get CT of the chest with contrast approved.  She is not hypoxic but has had persistent low-grade fever malaise and fatigue.  Have drawn a number of lab studies today including CBC, BMP, sed rate and C met.  Respiratory virus panel was also collected.  Discussed with patient at length.  We will await results of the studies.

## 2019-03-01 NOTE — Telephone Encounter (Signed)
Please have her get STAT CXR and schedule OV this afternoon. Had negative Covid test Feb 12. Put her in room and shut door. Has had a Covid vaccine recently.

## 2019-03-01 NOTE — Patient Instructions (Addendum)
Chest x-ray is abnormal showing enlarged cardiac silhouette and small left pleural effusion.  Lab studies drawn and pending.  Further instructions to follow once lab results are reviewed.  Respiratory virus panel collected.

## 2019-03-01 NOTE — Addendum Note (Signed)
Addended by: Mady Haagensen on: 03/01/2019 11:39 AM   Modules accepted: Orders

## 2019-03-01 NOTE — Telephone Encounter (Addendum)
Sally Hopkins called back to say she is continuing to have fever in the afternoons around 100, headache comes and goes, she also has pain under her collar bone that has moved down, it hurts to cough, no trouble breathing, she has been tested 3 times for COVID with negative results also negative for flu, she has had both vaccines.

## 2019-03-02 ENCOUNTER — Other Ambulatory Visit: Payer: Self-pay

## 2019-03-02 DIAGNOSIS — R05 Cough: Secondary | ICD-10-CM

## 2019-03-02 DIAGNOSIS — R059 Cough, unspecified: Secondary | ICD-10-CM

## 2019-03-02 DIAGNOSIS — R509 Fever, unspecified: Secondary | ICD-10-CM

## 2019-03-02 LAB — TIQ-NTM

## 2019-03-03 NOTE — Telephone Encounter (Signed)
Called patient to see if she had reported adverse reaction to [email or web address] - and she had filled out form but had not sent yet. I ask her to go ahead and send. I also let her know about her appointment with Dr Percival Spanish tomorrow, she had seen it on mychart and she stated she had been to that office many times with her husband. She also ask about other lab results and I told her they were not back yet.

## 2019-03-03 NOTE — Progress Notes (Signed)
Cardiology Office Note   Date:  03/04/2019   ID:  Sally Hopkins, DOB [birth year], MRN [id number]  PCP:  Sally Showers, MD  Cardiologist:   Sally primary care provider on file. Referring:  Sally Showers, MD  Chief Complaint  Patient presents with  . Chest Pain      History of Present Illness: Sally Hopkins is a 69 y.o. female who is referred by Sally Showers, MD for evaluation of a possible COVID 19 vaccine reaction.  Dr. Renold Hopkins thought she might have a cardiac involvement.    The patient has Sally past cardiac history.  She said she got the first dose of vaccine on January 19.  On the seventh she got her second dose.  She had some fever and went to bed that Sunday.  By Monday her fever was more intense and she was very fatigued.  She felt this in the next few days but then on Thursday of that week she started really feeling bad.  She had pressure behind her collar problems.  She had fevers and chills.  She has severe fatigue.  Since 1 month for several days and actually now is finally starting to resolve.  Her temperature a couple of days ago was 100-100.5.  She is not been febrile.  The pain behind the clavicle seems to be going away.  She has a little bit of residual discomfort in her left chest axillary.  She is not had any cough.  She has had Sally weight gain or leg swelling.  She did have a couple of molecular Covid test which were negative.  She was also tested and was negative for the flu.  Interestingly her IgG 3 days ago was positive for SARS-CoV-2 negative.  She has never had any cardiac problems.  Her son recently was diagnosed with alopecia totalis.  She has a younger grandson with celiac sprue.   Past Medical History:  Diagnosis Date  . Dyslipidemia   . HSV-1 infection   . Hypertension   . Meniere's disease     Past Surgical History:  Procedure Laterality Date  . Cervical cryosurgery    . NASAL SEPTUM SURGERY       Current Outpatient Medications  Medication  Sig Dispense Refill  . ALPRAZolam (XANAX) 0.25 MG tablet Take 1 tablet (0.25 mg total) by mouth as needed for anxiety. 30 tablet 5  . Ascorbic Acid (VITAMIN C) 100 MG tablet Take 100 mg by mouth daily.    . cetirizine (ZYRTEC) 10 MG tablet Take 10 mg by mouth daily.      . chlorthalidone (HYGROTON) 25 MG tablet TAKE 1 TABLET BY MOUTH DAILY. 90 tablet 3  . glucosamine-chondroitin 500-400 MG tablet Take 1 tablet by mouth 3 (three) times daily.    Marland Kitchen losartan (COZAAR) 100 MG tablet TAKE 1 TABLET BY MOUTH DAILY. 90 tablet 3  . metoprolol succinate (TOPROL-XL) 50 MG 24 hr tablet TAKE 1 TABLET DAILY AFTER A MEAL. 90 tablet 3  . Multiple Vitamin (MULTIVITAMIN) capsule Take 1 capsule by mouth daily.      . potassium chloride SA (KLOR-CON) 20 MEQ tablet TAKE 1 TABLET BY MOUTH TWICE DAILY. 180 tablet 3  . simvastatin (ZOCOR) 10 MG tablet TAKE ONE TABLET AT BEDTIME. 90 tablet 3  . valACYclovir (VALTREX) 500 MG tablet TAKE ONE TABLET daily for prophylaxis  For HSV 90 tablet 3  . zinc gluconate 50 MG tablet Take 50 mg by mouth daily.  Sally current facility-administered medications for this visit.    Allergies:   Erythromycin, Macrobid  [nitrofurantoin macrocrystal], Sulfa antibiotics, Penicillins, and Thimerosol [thimerosal]    Social History:  The patient  reports that she has never smoked. She has never used smokeless tobacco. She reports current alcohol use. She reports that she does not use drugs.   Family History:  The patient's family history includes ALS in her father; Heart disease in her mother.  See above for other details   ROS:  Please see the history of present illness.   Otherwise, review of systems are positive for none.   All other systems are reviewed and negative.    PHYSICAL EXAM: VS:  BP 128/74   Pulse 94   Temp 97.9 F (36.6 C)   Ht 5\' 2"  (1.575 m)   Wt 149 lb 3.2 oz (67.7 kg)   SpO2 97%   BMI 27.29 kg/m  , BMI Body mass index is 27.29 kg/m. GENERAL:  Well  appearing HEENT:  Pupils equal round and reactive, fundi not visualized, oral mucosa unremarkable NECK:  Sally jugular venous distention, waveform within normal limits, carotid upstroke brisk and symmetric, Sally bruits, Sally thyromegaly LYMPHATICS:  Sally cervical, inguinal adenopathy LUNGS:  Clear to auscultation bilaterally BACK:  Sally CVA tenderness CHEST:  Unremarkable HEART:  PMI not displaced or sustained,S1 and S2 within normal limits, Sally S3, Sally S4, Sally clicks, Sally rubs, Sally murmurs ABD:  Flat, positive bowel sounds normal in frequency in pitch, Sally bruits, Sally rebound, Sally guarding, Sally midline pulsatile mass, Sally hepatomegaly, Sally splenomegaly EXT:  2 plus pulses throughout, Sally edema, Sally cyanosis Sally clubbing SKIN:  Sally rashes Sally nodules NEURO:  Cranial nerves II through XII grossly intact, motor grossly intact throughout PSYCH:  Cognitively intact, oriented to person place and time    EKG:  EKG is ordered today. The ekg ordered today demonstrates sinus rhythm, rate 94, axis within normal limits, intervals within normal limits, incomplete right bundle branch block not present on previous.   Recent Labs: 11/05/2018: TSH 3.84 03/01/2019: ALT 44; Brain Natriuretic Peptide 35; BUN 26; Creat 1.00; Hemoglobin 11.8; Platelets 386; Potassium 3.7; Sodium 140    Lipid Panel    Component Value Date/Time   CHOL 223 (H) 11/05/2018 0929   TRIG 158 (H) 11/05/2018 0929   HDL 55 11/05/2018 0929   CHOLHDL 4.1 11/05/2018 0929   VLDL 28 10/27/2015 0927   LDLCALC 139 (H) 11/05/2018 0929      Wt Readings from Last 3 Encounters:  03/04/19 149 lb 3.2 oz (67.7 kg)  03/01/19 151 lb (68.5 kg)  11/06/18 158 lb (71.7 kg)      Other studies Reviewed: Additional studies/ records that were reviewed today include: Labs. Review of the above records demonstrates:  Please see elsewhere in the note.     ASSESSMENT AND PLAN:  CHEST PAIN: The patient had chest pains, subscapular pain and other symptoms as mentioned  above following the second dose of her Covid vaccine.  Interestingly her husband was diagnosed with Covid in early January and is quite ill with this.  She has never tested positive molecular or antigen testing though she has positive antibodies.  It is likely that she just had a significant antibody response to the vaccine maybe coupled to the previous response to exposure from her husband.  This could not be clearly known.  However, she never did test positive even in January after her husband was positive.  Given the fact  that she never clearly had Covid 19 and that this is more likely a response to the vaccine would be less likely that she would have myocardial and pericardial involvement.  Have not seen this reported following vaccination alone.  However, is reasonable to check an echocardiogram.  If this is normal I would not suggest further testing as her symptoms are starting to improve.  Recent reports out of [facility] suggested 4.5% risk of myocarditis with interstitial inflammation that is probably over reported that the real world experience will likely be less than 2%.  Screening MRI is not suggestive in the absence of other compelling symptoms.  Current medicines are reviewed at length with the patient today.  The patient does not have concerns regarding medicines.  The following changes have been made:  Sally change  Labs/ tests ordered today include:   Orders Placed This Encounter  Procedures  . EKG 12-Lead  . ECHOCARDIOGRAM COMPLETE     Disposition:   FU with me as needed based on the results of the above.     Signed, Minus Breeding, MD  03/04/2019 4:49 PM    [facility]

## 2019-03-04 ENCOUNTER — Other Ambulatory Visit: Payer: Self-pay

## 2019-03-04 ENCOUNTER — Encounter: Payer: Self-pay | Admitting: Cardiology

## 2019-03-04 ENCOUNTER — Ambulatory Visit (INDEPENDENT_AMBULATORY_CARE_PROVIDER_SITE_OTHER): Payer: PPO | Admitting: Cardiology

## 2019-03-04 ENCOUNTER — Other Ambulatory Visit: Payer: PPO | Admitting: Internal Medicine

## 2019-03-04 VITALS — BP 128/74 | HR 94 | Temp 97.9°F | Ht 62.0 in | Wt 149.2 lb

## 2019-03-04 DIAGNOSIS — I10 Essential (primary) hypertension: Secondary | ICD-10-CM

## 2019-03-04 DIAGNOSIS — J90 Pleural effusion, not elsewhere classified: Secondary | ICD-10-CM

## 2019-03-04 DIAGNOSIS — M1710 Unilateral primary osteoarthritis, unspecified knee: Secondary | ICD-10-CM

## 2019-03-04 DIAGNOSIS — R072 Precordial pain: Secondary | ICD-10-CM | POA: Diagnosis not present

## 2019-03-04 DIAGNOSIS — H8109 Meniere's disease, unspecified ear: Secondary | ICD-10-CM

## 2019-03-04 DIAGNOSIS — M179 Osteoarthritis of knee, unspecified: Secondary | ICD-10-CM

## 2019-03-04 DIAGNOSIS — R700 Elevated erythrocyte sedimentation rate: Secondary | ICD-10-CM

## 2019-03-04 DIAGNOSIS — Z0000 Encounter for general adult medical examination without abnormal findings: Secondary | ICD-10-CM

## 2019-03-04 LAB — COMPLETE METABOLIC PANEL WITH GFR
AG Ratio: 1.4 (calc) (ref 1.0–2.5)
ALT: 44 U/L — ABNORMAL HIGH (ref 6–29)
AST: 28 U/L (ref 10–35)
Albumin: 3.9 g/dL (ref 3.6–5.1)
Alkaline phosphatase (APISO): 51 U/L (ref 37–153)
BUN/Creatinine Ratio: 26 (calc) — ABNORMAL HIGH (ref 6–22)
BUN: 26 mg/dL — ABNORMAL HIGH (ref 7–25)
CO2: 24 mmol/L (ref 20–32)
Calcium: 9.4 mg/dL (ref 8.6–10.4)
Chloride: 102 mmol/L (ref 98–110)
Creat: 1 mg/dL — ABNORMAL HIGH (ref 0.50–0.99)
GFR, Est African American: 68 mL/min/{1.73_m2} (ref >=60)
GFR, Est Non African American: 58 mL/min/{1.73_m2} — ABNORMAL LOW (ref >=60)
Globulin: 2.7 g/dL (calc) (ref 1.9–3.7)
Glucose, Bld: 123 mg/dL — ABNORMAL HIGH (ref 65–99)
Potassium: 3.7 mmol/L (ref 3.5–5.3)
Sodium: 140 mmol/L (ref 135–146)
Total Bilirubin: 0.4 mg/dL (ref 0.2–1.2)
Total Protein: 6.6 g/dL (ref 6.1–8.1)

## 2019-03-04 LAB — CBC WITH DIFFERENTIAL/PLATELET
Absolute Monocytes: 561 cells/uL (ref 200–950)
Basophils Absolute: 36 cells/uL (ref 0–200)
Basophils Relative: 0.4 %
Eosinophils Absolute: 160 cells/uL (ref 15–500)
Eosinophils Relative: 1.8 %
HCT: 34.6 % — ABNORMAL LOW (ref 35.0–45.0)
Hemoglobin: 11.8 g/dL (ref 11.7–15.5)
Lymphs Abs: 1780 cells/uL (ref 850–3900)
MCH: 31.3 pg (ref 27.0–33.0)
MCHC: 34.1 g/dL (ref 32.0–36.0)
MCV: 91.8 fL (ref 80.0–100.0)
MPV: 9.9 fL (ref 7.5–12.5)
Monocytes Relative: 6.3 %
Neutro Abs: 6364 cells/uL (ref 1500–7800)
Neutrophils Relative %: 71.5 %
Platelets: 386 10*3/uL (ref 140–400)
RBC: 3.77 10*6/uL — ABNORMAL LOW (ref 3.80–5.10)
RDW: 11.9 % (ref 11.0–15.0)
Total Lymphocyte: 20 %
WBC: 8.9 10*3/uL (ref 3.8–10.8)

## 2019-03-04 LAB — TEST AUTHORIZATION

## 2019-03-04 LAB — SEDIMENTATION RATE: Sed Rate: 127 mm/h — ABNORMAL HIGH (ref 0–30)

## 2019-03-04 LAB — SAR COV2 SEROLOGY (COVID19)AB(IGG),IA: SARS CoV2 AB IGG: POSITIVE — AB

## 2019-03-04 LAB — BRAIN NATRIURETIC PEPTIDE: Brain Natriuretic Peptide: 35 pg/mL (ref <100)

## 2019-03-04 LAB — RESPIRATORY VIRUS PANEL

## 2019-03-04 NOTE — Progress Notes (Signed)
Many thanks. 

## 2019-03-04 NOTE — Patient Instructions (Signed)
Medication Instructions:  No Changes *If you need a refill on your cardiac medications before your next appointment, please call your pharmacy*  Lab Work: None  Testing/Procedures: Your physician has requested that you have an echocardiogram. Echocardiography is a painless test that uses sound waves to create images of your heart. It provides your doctor with information about the size and shape of your heart and how well your heart's chambers and valves are working. This procedure takes approximately one hour. There are no restrictions for this procedure. [place]  Follow-Up: At [facility], you and your health needs are our priority.  As part of our continuing mission to provide you with exceptional heart care, we have created designated Provider Care Teams.  These Care Teams include your primary Cardiologist (physician) and Advanced Practice Providers (APPs -  Physician Assistants and Nurse Practitioners) who all work together to provide you with the care you need, when you need it.  Your next appointment:   Follow up as needed

## 2019-03-05 ENCOUNTER — Other Ambulatory Visit: Payer: PPO | Admitting: Internal Medicine

## 2019-03-05 ENCOUNTER — Encounter: Payer: Self-pay | Admitting: Internal Medicine

## 2019-03-05 DIAGNOSIS — R059 Cough, unspecified: Secondary | ICD-10-CM

## 2019-03-05 DIAGNOSIS — R05 Cough: Secondary | ICD-10-CM | POA: Diagnosis not present

## 2019-03-05 DIAGNOSIS — R509 Fever, unspecified: Secondary | ICD-10-CM | POA: Diagnosis not present

## 2019-03-06 LAB — ANTI-DNA ANTIBODY, DOUBLE-STRANDED: ds DNA Ab: <1 IU/mL

## 2019-03-06 LAB — RHEUMATOID FACTOR: Rheumatoid fact SerPl-aCnc: <14 IU/mL (ref <14)

## 2019-03-06 LAB — CYCLIC CITRUL PEPTIDE ANTIBODY, IGG: Cyclic Citrullin Peptide Ab: <16 UNITS

## 2019-03-06 LAB — ANTI-NUCLEAR AB-TITER (ANA TITER): ANA Titer 1: 1:80 {titer} — ABNORMAL HIGH

## 2019-03-06 LAB — ANA: Anti Nuclear Antibody (ANA): POSITIVE — AB

## 2019-03-06 LAB — ANGIOTENSIN CONVERTING ENZYME: Angiotensin-Converting Enzyme: 12 U/L (ref 9–67)

## 2019-03-08 LAB — RESPIRATORY VIRUS PANEL

## 2019-03-18 ENCOUNTER — Ambulatory Visit (HOSPITAL_COMMUNITY): Payer: PPO | Attending: Internal Medicine

## 2019-03-18 ENCOUNTER — Other Ambulatory Visit: Payer: Self-pay

## 2019-03-18 DIAGNOSIS — R072 Precordial pain: Secondary | ICD-10-CM | POA: Diagnosis not present

## 2019-03-23 ENCOUNTER — Telehealth (INDEPENDENT_AMBULATORY_CARE_PROVIDER_SITE_OTHER): Payer: Self-pay | Admitting: Internal Medicine

## 2019-03-23 ENCOUNTER — Encounter: Payer: Self-pay | Admitting: Internal Medicine

## 2019-03-23 DIAGNOSIS — R9389 Abnormal findings on diagnostic imaging of other specified body structures: Secondary | ICD-10-CM

## 2019-03-23 NOTE — Telephone Encounter (Signed)
Follow up call to check on patient after apparent extreme reaction for several days after taking Covid-19 vaccine. She has a history of intolerances to vaccines. There was mention of cardiac enlargement on CXR report of Feb 22.   She was seen by Dr. Percival Spanish and Echo was ordered.Patient is feeling better. Had recent 2D Echocardiogram and is waiting to hear from Cardiology. It seems likely she had extreme reaction to Covid-19 vaccine with hx of vaccine intolerances. On Feb 22, she had CXR showing no definite infiltrate but minimal atelectasis right lung and subsegmental atelectasis lower left lung with tiny pleural effusion.   I would like for her to have repeat CXR and she agrees. Order placed. Question going forward will be whether she should have any booster Covi-19 vaccines and that might be best answered by an Allergist.

## 2019-03-25 ENCOUNTER — Ambulatory Visit
Admission: RE | Admit: 2019-03-25 | Discharge: 2019-03-25 | Disposition: A | Discharge status: Home / Self Care | Payer: PPO | Source: Ambulatory Visit | Attending: Internal Medicine | Admitting: Internal Medicine

## 2019-03-25 DIAGNOSIS — U071 COVID-19: Secondary | ICD-10-CM | POA: Diagnosis not present

## 2019-03-25 DIAGNOSIS — R918 Other nonspecific abnormal finding of lung field: Secondary | ICD-10-CM | POA: Diagnosis not present

## 2019-03-31 ENCOUNTER — Other Ambulatory Visit: Payer: Self-pay

## 2019-03-31 ENCOUNTER — Telehealth: Payer: Self-pay

## 2019-03-31 DIAGNOSIS — R072 Precordial pain: Secondary | ICD-10-CM

## 2019-03-31 NOTE — Progress Notes (Signed)
Minus Breeding, MD  03/30/2019 10:16 PM EDT    Have her come back and see me in 3 months. We can follow another echo in 12 months.

## 2019-03-31 NOTE — Telephone Encounter (Signed)
She can call Cardiology but I don't think so

## 2019-03-31 NOTE — Telephone Encounter (Signed)
Patient states she has a routine teeth cleaning appointment in a few weeks and she wants to know if she should take an antibiotic like amoxicillin before her appointment she is concerned with everything that's going on with her heart.

## 2019-04-01 NOTE — Telephone Encounter (Signed)
Spoke with patient and let her know to call her cardiologist. Patient verbalized understanding.

## 2019-06-24 DIAGNOSIS — R072 Precordial pain: Secondary | ICD-10-CM | POA: Insufficient documentation

## 2019-06-24 NOTE — Progress Notes (Signed)
Cardiology Office Note   Date:  06/25/2019   ID:  Sally Hopkins, DOB [birth year], MRN [id number]  PCP:  Sally Showers, MD  Cardiologist:   No primary care provider on file. Referring:  Sally Showers, MD  Chief Complaint  Patient presents with  . Chest Pain      History of Present Illness: Sally Hopkins is a 69 y.o. female who is referred by Sally Showers, MD for evaluation of a possible COVID 19 vaccine reaction.  Dr. Renold Genta thought she might have a cardiac involvement.  Echo was unremarkable except for moderate LVH.  I brought the patient back to discuss the left ventricular hypertrophy.  I think this is an incidental finding.  She has had some hypertension.  She feels much better.  She thinks she is recovered from her post vaccination event.  She denies any ongoing symptoms.  She denies any chest pressure, neck or arm discomfort.  Has had no new shortness of breath, PND or orthopnea.  She had no weight gain or edema.    Past Medical History:  Diagnosis Date  . Dyslipidemia   . HSV-1 infection   . Hypertension   . Meniere's disease     Past Surgical History:  Procedure Laterality Date  . Cervical cryosurgery    . NASAL SEPTUM SURGERY       Current Outpatient Medications  Medication Sig Dispense Refill  . ALPRAZolam (XANAX) 0.25 MG tablet Take 1 tablet (0.25 mg total) by mouth as needed for anxiety. 30 tablet 5  . Ascorbic Acid (VITAMIN C) 100 MG tablet Take 100 mg by mouth daily.    . cetirizine (ZYRTEC) 10 MG tablet Take 10 mg by mouth daily.      . chlorthalidone (HYGROTON) 25 MG tablet TAKE 1 TABLET BY MOUTH DAILY. 90 tablet 3  . glucosamine-chondroitin 500-400 MG tablet Take 1 tablet by mouth 3 (three) times daily.    Marland Kitchen losartan (COZAAR) 100 MG tablet TAKE 1 TABLET BY MOUTH DAILY. 90 tablet 3  . metoprolol succinate (TOPROL-XL) 50 MG 24 hr tablet TAKE 1 TABLET DAILY AFTER A MEAL. 90 tablet 3  . Multiple Vitamin (MULTIVITAMIN) capsule Take 1 capsule by  mouth daily.      . potassium chloride SA (KLOR-CON) 20 MEQ tablet TAKE 1 TABLET BY MOUTH TWICE DAILY. 180 tablet 3  . simvastatin (ZOCOR) 10 MG tablet TAKE ONE TABLET AT BEDTIME. 90 tablet 3  . valACYclovir (VALTREX) 500 MG tablet TAKE ONE TABLET daily for prophylaxis  For HSV 90 tablet 3  . zinc gluconate 50 MG tablet Take 50 mg by mouth daily.     No current facility-administered medications for this visit.    Allergies:   Erythromycin, Macrobid  [nitrofurantoin macrocrystal], Sulfa antibiotics, Penicillins, and Thimerosol [thimerosal]    ROS:  Please see the history of present illness.   Otherwise, review of systems are positive for none.   All other systems are reviewed and negative.    PHYSICAL EXAM: VS:  BP 124/68   Pulse 73   Ht 5\' 2"  (1.575 m)   Wt 145 lb 6.4 oz (66 kg)   SpO2 99%   BMI 26.59 kg/m  , BMI Body mass index is 26.59 kg/m. GENERAL:  Well appearing NECK:  No jugular venous distention, waveform within normal limits, carotid upstroke brisk and symmetric, no bruits, no thyromegaly LUNGS:  Clear to auscultation bilaterally CHEST:  Unremarkable HEART:  PMI not displaced or sustained,S1 and  S2 within normal limits, no S3, no S4, no clicks, no rubs, no murmurs ABD:  Flat, positive bowel sounds normal in frequency in pitch, no bruits, no rebound, no guarding, no midline pulsatile mass, no hepatomegaly, no splenomegaly EXT:  2 plus pulses throughout, no edema, no cyanosis no clubbing  EKG:  EKG is  not ordered today.   Recent Labs: 11/05/2018: TSH 3.84 03/01/2019: ALT 44; Brain Natriuretic Peptide 35; BUN 26; Creat 1.00; Hemoglobin 11.8; Platelets 386; Potassium 3.7; Sodium 140    Lipid Panel    Component Value Date/Time   CHOL 223 (H) 11/05/2018 0929   TRIG 158 (H) 11/05/2018 0929   HDL 55 11/05/2018 0929   CHOLHDL 4.1 11/05/2018 0929   VLDL 28 10/27/2015 0927   LDLCALC 139 (H) 11/05/2018 0929      Wt Readings from Last 3 Encounters:  06/25/19 145 lb  6.4 oz (66 kg)  03/04/19 149 lb 3.2 oz (67.7 kg)  03/01/19 151 lb (68.5 kg)      Other studies Reviewed: Additional studies/ records that were reviewed today include: None. Review of the above records demonstrates:  NA  ASSESSMENT AND PLAN:  CHEST PAIN:    She has had improvement in this.  She does have a right bundle branch block on EKG and some left ventricular hypertrophy.  However, there is no further symptoms to warrant further treatment or evaluation.  LVH: I will follow this up with an echocardiogram in 1 year.  Current medicines are reviewed at length with the patient today.  The patient does not have concerns regarding medicines.  The following changes have been made:  no change  Labs/ tests ordered today include:   Orders Placed This Encounter  Procedures  . ECHOCARDIOGRAM COMPLETE     Disposition:   FU with me as needed.     Signed, Minus Breeding, MD  06/25/2019 10:01 AM    [facility]

## 2019-06-25 ENCOUNTER — Encounter: Payer: Self-pay | Admitting: Cardiology

## 2019-06-25 ENCOUNTER — Other Ambulatory Visit: Payer: Self-pay

## 2019-06-25 ENCOUNTER — Ambulatory Visit: Payer: PPO | Admitting: Cardiology

## 2019-06-25 VITALS — BP 124/68 | HR 73 | Ht 62.0 in | Wt 145.4 lb

## 2019-06-25 DIAGNOSIS — I517 Cardiomegaly: Secondary | ICD-10-CM | POA: Diagnosis not present

## 2019-06-25 DIAGNOSIS — R072 Precordial pain: Secondary | ICD-10-CM

## 2019-06-25 NOTE — Patient Instructions (Signed)
Medication Instructions:  NO CHANGES *If you need a refill on your cardiac medications before your next appointment, please call your pharmacy*  Lab Work: NONE ORDERED THIS VISIT  Testing/Procedures: Your physician has requested that you have an echocardiogram IN June 2022. Echocardiography is a painless test that uses sound waves to create images of your heart. It provides your doctor with information about the size and shape of your heart and how well your hearts chambers and valves are working. This procedure takes approximately one hour. There are no restrictions for this procedure. [place]  Follow-Up: At [facility], you and your health needs are our priority.  As part of our continuing mission to provide you with exceptional heart care, we have created designated Provider Care Teams.  These Care Teams include your primary Cardiologist (physician) and Advanced Practice Providers (APPs -  Physician Assistants and Nurse Practitioners) who all work together to provide you with the care you need, when you need it.  Your next appointment:   FOLLOW UP AS NEEDED

## 2019-06-29 ENCOUNTER — Ambulatory Visit: Payer: PPO | Admitting: Cardiology

## 2019-07-09 DIAGNOSIS — M1711 Unilateral primary osteoarthritis, right knee: Secondary | ICD-10-CM | POA: Diagnosis not present

## 2019-07-09 DIAGNOSIS — M25561 Pain in right knee: Secondary | ICD-10-CM | POA: Diagnosis not present

## 2019-10-12 ENCOUNTER — Telehealth: Payer: Self-pay | Admitting: Internal Medicine

## 2019-10-12 NOTE — Telephone Encounter (Signed)
I think there is no rush and that we can refer her to [facility] to get an opinion about it. Would she want to go? If so, please put in referral.

## 2019-10-12 NOTE — Telephone Encounter (Signed)
Sally Hopkins [phone]  Joscelyne called to ask should she get the COVID antibody lab test when she gets her labs in a few weeks, she is trying to decide about getting the COVID Booster. She is wandering if she should get it, since she got so sick with the second shot, maybe she has enough antibodies and does not need it. What do you suggest?

## 2019-10-12 NOTE — Telephone Encounter (Signed)
LVM to call office.

## 2019-10-12 NOTE — Telephone Encounter (Signed)
She will wait and think about it some more and maybe talk about it at her up coming appointment.

## 2019-10-29 ENCOUNTER — Other Ambulatory Visit: Payer: Self-pay | Admitting: Internal Medicine

## 2019-11-02 ENCOUNTER — Other Ambulatory Visit: Payer: PPO | Admitting: Internal Medicine

## 2019-11-02 ENCOUNTER — Other Ambulatory Visit: Payer: Self-pay

## 2019-11-02 DIAGNOSIS — E782 Mixed hyperlipidemia: Secondary | ICD-10-CM

## 2019-11-02 DIAGNOSIS — E7849 Other hyperlipidemia: Secondary | ICD-10-CM | POA: Diagnosis not present

## 2019-11-02 DIAGNOSIS — Z87898 Personal history of other specified conditions: Secondary | ICD-10-CM

## 2019-11-02 DIAGNOSIS — Z800 Family history of malignant neoplasm of digestive organs: Secondary | ICD-10-CM

## 2019-11-02 DIAGNOSIS — Z0000 Encounter for general adult medical examination without abnormal findings: Secondary | ICD-10-CM | POA: Diagnosis not present

## 2019-11-02 DIAGNOSIS — I10 Essential (primary) hypertension: Secondary | ICD-10-CM | POA: Diagnosis not present

## 2019-11-02 DIAGNOSIS — H8109 Meniere's disease, unspecified ear: Secondary | ICD-10-CM

## 2019-11-03 LAB — COMPLETE METABOLIC PANEL WITH GFR
AG Ratio: 1.6 (calc) (ref 1.0–2.5)
ALT: 20 U/L (ref 6–29)
AST: 18 U/L (ref 10–35)
Albumin: 4.2 g/dL (ref 3.6–5.1)
Alkaline phosphatase (APISO): 41 U/L (ref 37–153)
BUN/Creatinine Ratio: 30 (calc) — ABNORMAL HIGH (ref 6–22)
BUN: 30 mg/dL — ABNORMAL HIGH (ref 7–25)
CO2: 21 mmol/L (ref 20–32)
Calcium: 9.4 mg/dL (ref 8.6–10.4)
Chloride: 107 mmol/L (ref 98–110)
Creat: 0.99 mg/dL (ref 0.50–0.99)
GFR, Est African American: 68 mL/min/{1.73_m2} (ref >=60)
GFR, Est Non African American: 59 mL/min/{1.73_m2} — ABNORMAL LOW (ref >=60)
Globulin: 2.6 g/dL (calc) (ref 1.9–3.7)
Glucose, Bld: 94 mg/dL (ref 65–99)
Potassium: 3.7 mmol/L (ref 3.5–5.3)
Sodium: 138 mmol/L (ref 135–146)
Total Bilirubin: 0.6 mg/dL (ref 0.2–1.2)
Total Protein: 6.8 g/dL (ref 6.1–8.1)

## 2019-11-03 LAB — CBC WITH DIFFERENTIAL/PLATELET
Absolute Monocytes: 457 cells/uL (ref 200–950)
Basophils Absolute: 50 cells/uL (ref 0–200)
Basophils Relative: 0.9 %
Eosinophils Absolute: 286 cells/uL (ref 15–500)
Eosinophils Relative: 5.2 %
HCT: 36.5 % (ref 35.0–45.0)
Hemoglobin: 12.8 g/dL (ref 11.7–15.5)
Lymphs Abs: 1518 cells/uL (ref 850–3900)
MCH: 33.3 pg — ABNORMAL HIGH (ref 27.0–33.0)
MCHC: 35.1 g/dL (ref 32.0–36.0)
MCV: 95.1 fL (ref 80.0–100.0)
MPV: 10.9 fL (ref 7.5–12.5)
Monocytes Relative: 8.3 %
Neutro Abs: 3190 cells/uL (ref 1500–7800)
Neutrophils Relative %: 58 %
Platelets: 205 10*3/uL (ref 140–400)
RBC: 3.84 10*6/uL (ref 3.80–5.10)
RDW: 12.4 % (ref 11.0–15.0)
Total Lymphocyte: 27.6 %
WBC: 5.5 10*3/uL (ref 3.8–10.8)

## 2019-11-03 LAB — HEMOGLOBIN A1C
Hgb A1c MFr Bld: 5.3 % of total Hgb (ref <5.7)
Mean Plasma Glucose: 105 (calc)
eAG (mmol/L): 5.8 (calc)

## 2019-11-03 LAB — TSH: TSH: 3.28 mIU/L (ref 0.40–4.50)

## 2019-11-03 LAB — LIPID PANEL
Cholesterol: 228 mg/dL — ABNORMAL HIGH (ref <200)
HDL: 64 mg/dL (ref >=50)
LDL Cholesterol (Calc): 141 mg/dL (calc) — ABNORMAL HIGH
Non-HDL Cholesterol (Calc): 164 mg/dL (calc) — ABNORMAL HIGH (ref <130)
Total CHOL/HDL Ratio: 3.6 (calc) (ref <5.0)
Triglycerides: 113 mg/dL (ref <150)

## 2019-11-04 ENCOUNTER — Other Ambulatory Visit: Payer: PPO | Admitting: Internal Medicine

## 2019-11-08 ENCOUNTER — Encounter: Payer: Self-pay | Admitting: Internal Medicine

## 2019-11-08 ENCOUNTER — Ambulatory Visit (INDEPENDENT_AMBULATORY_CARE_PROVIDER_SITE_OTHER): Payer: PPO | Admitting: Internal Medicine

## 2019-11-08 ENCOUNTER — Other Ambulatory Visit: Payer: Self-pay

## 2019-11-08 VITALS — BP 120/70 | HR 74 | Ht 61.0 in | Wt 151.0 lb

## 2019-11-08 DIAGNOSIS — E876 Hypokalemia: Secondary | ICD-10-CM | POA: Diagnosis not present

## 2019-11-08 DIAGNOSIS — L500 Allergic urticaria: Secondary | ICD-10-CM | POA: Diagnosis not present

## 2019-11-08 DIAGNOSIS — Z8619 Personal history of other infectious and parasitic diseases: Secondary | ICD-10-CM

## 2019-11-08 DIAGNOSIS — B009 Herpesviral infection, unspecified: Secondary | ICD-10-CM | POA: Diagnosis not present

## 2019-11-08 DIAGNOSIS — I10 Essential (primary) hypertension: Secondary | ICD-10-CM | POA: Diagnosis not present

## 2019-11-08 DIAGNOSIS — E782 Mixed hyperlipidemia: Secondary | ICD-10-CM | POA: Diagnosis not present

## 2019-11-08 DIAGNOSIS — J301 Allergic rhinitis due to pollen: Secondary | ICD-10-CM | POA: Diagnosis not present

## 2019-11-08 DIAGNOSIS — Z800 Family history of malignant neoplasm of digestive organs: Secondary | ICD-10-CM

## 2019-11-08 DIAGNOSIS — Z20822 Contact with and (suspected) exposure to covid-19: Secondary | ICD-10-CM

## 2019-11-08 DIAGNOSIS — T50B95A Adverse effect of other viral vaccines, initial encounter: Secondary | ICD-10-CM

## 2019-11-08 DIAGNOSIS — Z0000 Encounter for general adult medical examination without abnormal findings: Secondary | ICD-10-CM

## 2019-11-08 DIAGNOSIS — H8109 Meniere's disease, unspecified ear: Secondary | ICD-10-CM

## 2019-11-08 LAB — POCT URINALYSIS DIPSTICK
Appearance: NEGATIVE
Bilirubin, UA: NEGATIVE
Blood, UA: NEGATIVE
Glucose, UA: NEGATIVE
Ketones, UA: NEGATIVE
Leukocytes, UA: NEGATIVE
Nitrite, UA: NEGATIVE
Odor: NEGATIVE
Protein, UA: NEGATIVE
Spec Grav, UA: 1.025 (ref 1.010–1.025)
Urobilinogen, UA: 0.2 E.U./dL
pH, UA: 5 (ref 5.0–8.0)

## 2019-11-08 MED ORDER — SIMVASTATIN 20 MG PO TABS: 20.0000 mg | ORAL_TABLET | Freq: Every day | ORAL | 0 refills | Status: DC

## 2019-11-08 NOTE — Progress Notes (Signed)
Subjective:    Patient ID: Sally Hopkins, female    DOB: [birth year], 69 y.o.   MRN: [id number]  HPI 69 year old Female in today for health maintenance exam and Medicare wellness visit.  She had her 2nd COVID-19 immunization in February 2021 and had an awful reaction with fever chills myalgias malaise and fatigue that was protracted.  She had a chest x-ray on February 22 revealing enlargement of cardiac silhouette.  There was subsegmental atelectasis lower left lung with tiny left pleural effusion.  Minimal atelectasis at right base.  No definite infiltrate or pneumothorax.  She was seen by Dr. Percival Spanish because I was concerned she might have a myocarditis from the vaccine.  She complained of fever after the vaccine, chest pressure, chills, fatigue.  Had low-grade fever up to 100.5 degrees.  Had COVID-19 test which were negative.  Tested negative for the flu.  She had a positive COVID-19 antibody in late February but had received the COVID-19 vaccine first dose on February 7.  She had an EKG done by Dr. Percival Spanish and echocardiogram both of which were normal.  He did not feel she had myocarditis.  Her symptoms subsequently resolved spontaneously.  She now has to decide if she will get her third booster.  She has a history of Mnire's disease which is stable and in remission.  History of hyperlipidemia, hypertension,  Herpes simplex type II.  She takes potassium supplement due to hypokalemia from diuretic therapy.  History of allergic urticaria and allergic rhinitis seen by Dr. Neldon Mc.  History of mildly elevated serum glucose with normal hemoglobin A1c.  Hemoglobin A1c drawn in association with this visit is normal at 5.3%.  She wanted her Covid antibodies tested today and result is less than 1 IgG which is insufficient.   Her lipids are elevated.  Total cholesterol 228, HDL 64, triglycerides are normal at 113 and LDL cholesterol is 141.  This is about the same as it was a year ago.  CBC and c-Met are  normal.  TSH is normal.  Dipstick UA is normal.  Social history: She is married with 2 adult children.  Does not smoke.  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  Graduated from [employer]  Social alcohol consumption.  Family history: Father died at age 1 of ALS but also had colon cancer.  Mother with history of open heart surgery 1969 for congenital heart disease and also had CABG.  1 sister in good health.  Patient declines colonoscopy.  Spoke with her about Cologuard today.  Due to family history of colon cancer, it is important that she consider some form of screening.  History of basal cell carcinomas treated by [facility].  History of primary osteoarthritis left knee seen by Dr. Alvan Dame.  Patient is allergic to thimerosal which has been used as a preservative in vaccines but she may take annual flu vaccine since single-dose vials do not contain thimerosal.  Review of Systems see above-no new complaints just concerned about taking the booster for COVID-19     Objective:   Physical Exam Blood pressure 120/70 pulse 74 she is afebrile, pulse oximetry 98% weight 151 pounds height 5 feet 1 inches  Skin warm and dry.  Nodes none.  TMs clear.  Neck is supple without JVD thyromegaly or carotid bruits.  No thyromegaly.  Chest clear to auscultation without rales or wheezing.  Breast without masses.  Cardiac exam regular rate and rhythm normal S1 and S2  without murmurs or gallops.  Abdomen soft nondistended without hepatosplenomegaly masses or tenderness.  No lower extremity pitting edema.  Neuro: Intact without gross focal deficits.  GYN exam deferred to GYN       Assessment & Plan:  History of reaction to COVID-19 vaccine-she will need to consider whether or not she wants to take third dose.  History of colon cancer in her father-would like for her to at least consider Cologuard but prefer she would consider colonoscopy.  Essential  hypertension-stable on losartan, chlorthalidone, metoprolol.  Hyperlipidemia-lipid panel is still elevated.  Increase Zocor from 10 to 20 mg daily.  Recommend follow-up in 6 months.  Allergic rhinitis treated with Zyrtec  History of allergic urticaria  Hypokalemia secondary to diuretic therapy  History of Mnire's disease  Thimerosal allergy  History of anxiety treated with 0.25 mg Xanax sparingly as needed.  History of HPV and abnormal Pap smears treated by GYN physician  History of HSV type II  Plan: Recommend increasing Zocor from 10 to 20 mg daily and following up in 6 months.  She has had pneumococcal 23 and Prevnar 13 vaccines.  Tetanus immunization is up-to-date.  Was not given flu vaccine today at her request.  She will get high-dose flu vaccine elsewhere.  Subjective:   Patient presents for Medicare Annual/Subsequent preventive examination.  Review Past Medical/Family/Social: See above   Risk Factors  Current exercise habits: Physically active Dietary issues discussed: Low-fat low carbohydrate discussed  Cardiac risk factors: Hyperlipidemia, hypertension, family history  Depression Screen  (Note: if answer to either of the following is "Yes", a more complete depression screening is indicated)   Over the past two weeks, have you felt down, depressed or hopeless? No  Over the past two weeks, have you felt little interest or pleasure in doing things? No Have you lost interest or pleasure in daily life? No Do you often feel hopeless? No Do you cry easily over simple problems? No   Activities of Daily Living  In your present state of health, do you have any difficulty performing the following activities?:   Driving? No  Managing money? No  Feeding yourself? No  Getting from bed to chair? No  Climbing a flight of stairs? No  Preparing food and eating?: No  Bathing or showering? No  Getting dressed: No  Getting to the toilet? No  Using the toilet:No  Moving  around from place to place: No  In the past year have you fallen or had a near fall?:No  Are you sexually active?  Yes  do you have more than one partner? 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  Yes Do you experience ringing or noises in your ears?  Yes Do you have difficulty understanding soft or whispered voices?  yes Do you feel that you have a problem with memory?  No Do you often misplace items? No    Home Safety:  Do you have a smoke alarm at your residence? Yes Do you have grab bars in the bathroom?  No Do you have throw rugs in your house?   Cognitive Testing  Alert? Yes Normal Appearance?Yes  Oriented to person? Yes Place? Yes  Time? Yes  Recall of three objects? Yes  Can perform simple calculations? Yes  Displays appropriate judgment?Yes  Can read the correct time from a watch face?Yes   List the Names of  Other Physician/Practitioners you currently use:  See referral list for the physicians patient is currently seeing.  See above   Review of Systems: See above   Objective:     General appearance: Appears younger than stated age Head: Normocephalic, without obvious abnormality, atraumatic  Eyes: conj clear, EOMi PEERLA  Ears: normal TM's and external ear canals both ears  Nose: Nares normal. Septum midline. Mucosa normal. No drainage or sinus tenderness.  Throat: lips, mucosa, and tongue normal; teeth and gums normal  Neck: no adenopathy, no carotid bruit, no JVD, supple, symmetrical, trachea midline and thyroid not enlarged, symmetric, no tenderness/mass/nodules  No CVA tenderness.  Lungs: clear to auscultation bilaterally  Breasts: normal appearance, no masses or tenderness Heart: regular rate and rhythm, S1, S2 normal, no murmur, click, rub or gallop  Abdomen: soft, non-tender; bowel sounds normal; no masses, no organomegaly  Musculoskeletal:  ROM normal in all joints, no crepitus, no deformity, Normal muscle strengthen. Back  is symmetric, no curvature. Skin: Skin color, texture, turgor normal. No rashes or lesions  Lymph nodes: Cervical, supraclavicular, and axillary nodes normal.  Neurologic: CN 2 -12 Normal, Normal symmetric reflexes. Normal coordination and gait  Psych: Alert & Oriented x 3, Mood appear stable.    Assessment:    Annual wellness medicare exam   Plan:    During the course of the visit the patient was educated and counseled about appropriate screening and preventive services including:   She will consider whether she would like to get third dose of COVID-19 given her previous reaction history  Declines flu vaccine  Declines colon cancer screening     Patient Instructions (the written plan) was given to the patient.  Medicare Attestation  I have personally reviewed:  The patient's medical and social history  Their use of alcohol, tobacco or illicit drugs  Their current medications and supplements  The patient's functional ability including ADLs,fall risks, home safety risks, cognitive, and hearing and visual impairment  Diet and physical activities  Evidence for depression or mood disorders  The patient's weight, height, BMI, and visual acuity have been recorded in the chart. I have made referrals, counseling, and provided education to the patient based on review of the above and I have provided the patient with a written personalized care plan for preventive services.

## 2019-11-08 NOTE — Patient Instructions (Addendum)
It was a pleasure to see you today.  Increase Zocor to 20 mg daily and follow-up in 6 months.  Had flu vaccine at pharmacy.  Patient carefully consider whether or not she wants third Covid vaccine given adverse reaction previously.  Please consider colon cancer screening due to family history.  It appears you have insufficient antibody protection to COVID-19 based on antibody test recently.

## 2019-11-09 ENCOUNTER — Other Ambulatory Visit: Payer: PPO | Admitting: Internal Medicine

## 2019-11-09 DIAGNOSIS — Z20822 Contact with and (suspected) exposure to covid-19: Secondary | ICD-10-CM

## 2019-11-10 LAB — SARS-COV-2 ANTIBODY(IGG)SPIKE,SEMI-QUANTITATIVE: SARS COV1 AB(IGG)SPIKE,SEMI QN: <1 index (ref <1.00)

## 2019-12-07 DIAGNOSIS — Z85828 Personal history of other malignant neoplasm of skin: Secondary | ICD-10-CM | POA: Diagnosis not present

## 2019-12-07 DIAGNOSIS — D2262 Melanocytic nevi of left upper limb, including shoulder: Secondary | ICD-10-CM | POA: Diagnosis not present

## 2019-12-07 DIAGNOSIS — L738 Other specified follicular disorders: Secondary | ICD-10-CM | POA: Diagnosis not present

## 2019-12-07 DIAGNOSIS — D2261 Melanocytic nevi of right upper limb, including shoulder: Secondary | ICD-10-CM | POA: Diagnosis not present

## 2019-12-07 DIAGNOSIS — L821 Other seborrheic keratosis: Secondary | ICD-10-CM | POA: Diagnosis not present

## 2019-12-07 DIAGNOSIS — D225 Melanocytic nevi of trunk: Secondary | ICD-10-CM | POA: Diagnosis not present

## 2019-12-22 DIAGNOSIS — Z1231 Encounter for screening mammogram for malignant neoplasm of breast: Secondary | ICD-10-CM | POA: Diagnosis not present

## 2019-12-22 LAB — HM MAMMOGRAPHY

## 2019-12-29 ENCOUNTER — Other Ambulatory Visit: Payer: Self-pay | Admitting: Internal Medicine

## 2020-01-05 DIAGNOSIS — M1711 Unilateral primary osteoarthritis, right knee: Secondary | ICD-10-CM | POA: Diagnosis not present

## 2020-01-05 DIAGNOSIS — M25562 Pain in left knee: Secondary | ICD-10-CM | POA: Diagnosis not present

## 2020-01-05 DIAGNOSIS — M1712 Unilateral primary osteoarthritis, left knee: Secondary | ICD-10-CM | POA: Diagnosis not present

## 2020-01-05 DIAGNOSIS — M25561 Pain in right knee: Secondary | ICD-10-CM | POA: Diagnosis not present

## 2020-01-05 DIAGNOSIS — M170 Bilateral primary osteoarthritis of knee: Secondary | ICD-10-CM | POA: Diagnosis not present

## 2020-03-14 ENCOUNTER — Telehealth: Payer: Self-pay | Admitting: Internal Medicine

## 2020-03-14 NOTE — Telephone Encounter (Signed)
After talking with Dr Baxley scheduled appointment ?

## 2020-03-14 NOTE — Telephone Encounter (Signed)
Sally Hopkins [phone]  [name] called to say she had fallen last night around 7:30 in her kitchen and hit her head, she has had a dual headache ever since, she has several bruises and cuts on her ear hands and knee. No nausea and she did not loss consciousness.

## 2020-03-15 ENCOUNTER — Encounter: Payer: Self-pay | Admitting: Internal Medicine

## 2020-03-15 ENCOUNTER — Other Ambulatory Visit: Payer: Self-pay

## 2020-03-15 ENCOUNTER — Ambulatory Visit (INDEPENDENT_AMBULATORY_CARE_PROVIDER_SITE_OTHER): Payer: PPO | Admitting: Internal Medicine

## 2020-03-15 VITALS — BP 140/80 | HR 68 | Temp 98.7°F | Ht 61.0 in | Wt 155.0 lb

## 2020-03-15 DIAGNOSIS — Z8616 Personal history of COVID-19: Secondary | ICD-10-CM | POA: Diagnosis not present

## 2020-03-15 DIAGNOSIS — H8109 Meniere's disease, unspecified ear: Secondary | ICD-10-CM | POA: Diagnosis not present

## 2020-03-15 DIAGNOSIS — W19XXXA Unspecified fall, initial encounter: Secondary | ICD-10-CM | POA: Diagnosis not present

## 2020-03-15 DIAGNOSIS — E782 Mixed hyperlipidemia: Secondary | ICD-10-CM | POA: Diagnosis not present

## 2020-03-15 DIAGNOSIS — I10 Essential (primary) hypertension: Secondary | ICD-10-CM | POA: Diagnosis not present

## 2020-03-15 NOTE — Progress Notes (Signed)
   Subjective:    Patient ID: Sally Hopkins, female    DOB: 1950/06/09, 69 y.o.   MRN: [id number]  HPI 70 year old Female seen regarding a fall at her home this past Monday evening March 7th. She and her husband had returned home. She had her computer bag on the floor and caught her foot in it and took a hard fall to the floor and door joist.  She did not lose consciousness.  Has had a mild headache.  No vomiting.  No visual disturbances. She struck the right side of her head. Husband watched her after the fall. Did not exhibit signs of concussion.  Has some mild residual headache.  Tdap is up to date.    Review of Systems Patient says she had Covid-19 in January diagnosed with home test with relatively mild symptoms. Thinks she was exposed in airport in [place], [place]. Has  had 3 Covid vaccines- last was Feb 2021.     Objective:   Physical Exam  BP 140/80 pulse 68 regular T 98.7 degrees Weight 155 pounds  Skin is warm and dry. PERLA;  EOM's are full; Moves all 4 extremities. No facial asymmetry.  Muscle strength is normal in the upper and lower extremities.  No depressions of her head.  Cranial nerves II through XII are grossly intact.  Sensation is intact.  Has some scattered abrasions secondary to her fall that are superficial.  Chest is clear to auscultation.  Cardiac exam regular rate and rhythm.  No lower extremity deformity or edema.  Gait is within normal limits.      Assessment & Plan:  Tripped on computer bag resulting in fall with head trauma  No evidence of neurological deficits on exam today  History of COVID-26 January 2020  History of Mnire's disease-fall was not triggered by this  Essential hypertension-stable.  Treated with losartan and metoprolol and diuretic  Hyperlipidemia treated with simvastatin  Plan: Her neurological exam is normal.  I think she can monitor her symptoms and let me know if symptoms worsen.  I do not think she needs imaging of her  head at this point in time.  Her blood pressure is stable.

## 2020-03-16 NOTE — Patient Instructions (Signed)
Your neurological exam is normal.  Blood pressure is stable.  Continue to monitor your symptoms and call if new symptoms develop.  Tetanus immunization is up-to-date.

## 2020-03-28 ENCOUNTER — Other Ambulatory Visit: Payer: Self-pay | Admitting: Internal Medicine

## 2020-05-12 ENCOUNTER — Other Ambulatory Visit: Payer: PPO | Admitting: Internal Medicine

## 2020-05-12 ENCOUNTER — Other Ambulatory Visit: Payer: Self-pay

## 2020-05-12 DIAGNOSIS — E782 Mixed hyperlipidemia: Secondary | ICD-10-CM | POA: Diagnosis not present

## 2020-05-13 LAB — HEPATIC FUNCTION PANEL
AG Ratio: 1.7 (calc) (ref 1.0–2.5)
ALT: 18 U/L (ref 6–29)
AST: 18 U/L (ref 10–35)
Albumin: 4.4 g/dL (ref 3.6–5.1)
Alkaline phosphatase (APISO): 43 U/L (ref 37–153)
Bilirubin, Direct: 0.1 mg/dL (ref 0.0–0.2)
Globulin: 2.6 g/dL (calc) (ref 1.9–3.7)
Indirect Bilirubin: 0.5 mg/dL (calc) (ref 0.2–1.2)
Total Bilirubin: 0.6 mg/dL (ref 0.2–1.2)
Total Protein: 7 g/dL (ref 6.1–8.1)

## 2020-05-13 LAB — LIPID PANEL
Cholesterol: 207 mg/dL — ABNORMAL HIGH (ref <200)
HDL: 66 mg/dL (ref >=50)
LDL Cholesterol (Calc): 118 mg/dL (calc) — ABNORMAL HIGH
Non-HDL Cholesterol (Calc): 141 mg/dL (calc) — ABNORMAL HIGH (ref <130)
Total CHOL/HDL Ratio: 3.1 (calc) (ref <5.0)
Triglycerides: 115 mg/dL (ref <150)

## 2020-05-16 ENCOUNTER — Encounter: Payer: Self-pay | Admitting: Internal Medicine

## 2020-05-16 ENCOUNTER — Other Ambulatory Visit: Payer: Self-pay

## 2020-05-16 ENCOUNTER — Ambulatory Visit (INDEPENDENT_AMBULATORY_CARE_PROVIDER_SITE_OTHER): Payer: PPO | Admitting: Internal Medicine

## 2020-05-16 VITALS — BP 110/70 | HR 60 | Ht 61.0 in | Wt 156.0 lb

## 2020-05-16 DIAGNOSIS — I10 Essential (primary) hypertension: Secondary | ICD-10-CM

## 2020-05-16 DIAGNOSIS — B009 Herpesviral infection, unspecified: Secondary | ICD-10-CM

## 2020-05-16 DIAGNOSIS — H8109 Meniere's disease, unspecified ear: Secondary | ICD-10-CM

## 2020-05-16 DIAGNOSIS — E782 Mixed hyperlipidemia: Secondary | ICD-10-CM | POA: Diagnosis not present

## 2020-05-16 DIAGNOSIS — L500 Allergic urticaria: Secondary | ICD-10-CM

## 2020-05-16 DIAGNOSIS — Z0000 Encounter for general adult medical examination without abnormal findings: Secondary | ICD-10-CM

## 2020-05-16 DIAGNOSIS — J301 Allergic rhinitis due to pollen: Secondary | ICD-10-CM | POA: Diagnosis not present

## 2020-05-16 NOTE — Progress Notes (Signed)
   Subjective:    Patient ID: Sally Hopkins, female    DOB: [birth year], 70 y.o.   MRN: [id number]  HPI 70 year old Female seen today for 11-month recheck.  History of allergic rhinitis, allergic urticaria, herpes simplex type II, hyperlipidemia, hypertension, Mnire's disease and osteoarthritis of the knee.  She has had 3 COVID-19 immunizations.  Husband had COVID-19 in January 2021 but she never tested positive.  However she developed fever up to 102 degrees after second COVID-vaccine and had right arm which was warm and red to touch.  Continued with fever and shaking chills.  I thought perhaps she had a reaction to the COVID-vaccine.  She was concerned as she had subsegmental atelectasis left lower lung with tiny left pleural effusion and minimal atelectasis in the right base.  She saw Dr. Percival Spanish for evaluation of chest pain.  The thought of her having had COVID-19 around the time her husband was able was entertained but it was plausible she had a significant antibody response to the vaccine couple with exposure from her husband.  She did have an echocardiogram which was normal.  Is seeing Dr. Alvan Dame for unilateral primary osteoarthritis right knee.  Is seeing Dr. Wilhemina Bonito for nevi check.  She had lipid panel drawn here recently.  Her LDL was elevated at 118 and previously was 141 when checked 6 months ago.  Total cholesterol was 207, HDL 66 and triglycerides 115.  In December 2021 Zocor was increased from 10 to 20 mg daily.  She will work on diet and exercise and we will continue with the same dose.  Follow-up in 6 months.    Review of Systems see above-no new complaints     Objective:   Physical Exam BP 110/70 pulse 60 pulse ox 97% Weight 156 pounds BMI 29.48 Neck is supple without JVD thyromegaly or carotid bruits.  Chest clear to auscultation.  Cardiac exam: Regular rate and rhythm normal S1 and S2 without murmurs.  No lower extremity pitting edema.  Affect thought and judgment are  normal.  Sometimes has anxiety about her health.  Essential hypertension-stable on losartan 100 mg daily and metoprolol as well as hypertonic.  She takes potassium supplement.  History of mixed hyperlipidemia -continue to work on diet and exercise and continue with Zocor 20 mg daily.  Reevaluate in November.  History of HSV-takes Valtrex as needed  History of Mnire's disease-stable  Allergic rhinitis and allergic urticaria currently stable  Plan: Health maintenance exam and Medicare wellness visit scheduled for November 2022.      Assessment & Plan:

## 2020-05-19 ENCOUNTER — Telehealth: Payer: Self-pay | Admitting: Internal Medicine

## 2020-05-19 NOTE — Telephone Encounter (Signed)
Kelci Mcgowen [phone]  Neziah called to say her husband has tested positive for COVID, she has not yet. She is not having any symptoms, and she tested negative. She wanted to know if you could call her in the medication. I let her know she would need positive COVID test and to be having symptoms. She will call us back.

## 2020-05-28 NOTE — Patient Instructions (Signed)
It was a pleasure to see you today.  Continue current medications.  Work on diet and exercise and follow-up in November for [employer] wellness and health maintenance exam.

## 2020-06-21 ENCOUNTER — Other Ambulatory Visit (HOSPITAL_COMMUNITY): Payer: PPO

## 2020-06-21 ENCOUNTER — Other Ambulatory Visit: Payer: Self-pay | Admitting: Internal Medicine

## 2020-06-26 ENCOUNTER — Ambulatory Visit (HOSPITAL_COMMUNITY): Payer: PPO | Attending: Cardiology

## 2020-06-26 ENCOUNTER — Other Ambulatory Visit: Payer: Self-pay

## 2020-06-26 DIAGNOSIS — I517 Cardiomegaly: Secondary | ICD-10-CM | POA: Diagnosis not present

## 2020-06-26 LAB — ECHOCARDIOGRAM COMPLETE
Area-P 1/2: 3.63 cm2
P 1/2 time: 696 msec
S' Lateral: 2.7 cm

## 2020-07-12 DIAGNOSIS — M25561 Pain in right knee: Secondary | ICD-10-CM | POA: Diagnosis not present

## 2020-07-12 DIAGNOSIS — M1712 Unilateral primary osteoarthritis, left knee: Secondary | ICD-10-CM | POA: Diagnosis not present

## 2020-07-12 DIAGNOSIS — M170 Bilateral primary osteoarthritis of knee: Secondary | ICD-10-CM | POA: Diagnosis not present

## 2020-07-12 DIAGNOSIS — M25562 Pain in left knee: Secondary | ICD-10-CM | POA: Diagnosis not present

## 2020-07-12 DIAGNOSIS — M1711 Unilateral primary osteoarthritis, right knee: Secondary | ICD-10-CM | POA: Diagnosis not present

## 2020-09-10 ENCOUNTER — Other Ambulatory Visit: Payer: Self-pay | Admitting: Internal Medicine

## 2020-09-17 ENCOUNTER — Other Ambulatory Visit: Payer: Self-pay | Admitting: Internal Medicine

## 2020-10-13 ENCOUNTER — Other Ambulatory Visit: Payer: Self-pay | Admitting: Internal Medicine

## 2020-11-07 ENCOUNTER — Other Ambulatory Visit: Payer: Self-pay

## 2020-11-07 ENCOUNTER — Other Ambulatory Visit: Payer: PPO | Admitting: Internal Medicine

## 2020-11-07 DIAGNOSIS — I10 Essential (primary) hypertension: Secondary | ICD-10-CM

## 2020-11-07 DIAGNOSIS — Z0000 Encounter for general adult medical examination without abnormal findings: Secondary | ICD-10-CM

## 2020-11-07 DIAGNOSIS — E782 Mixed hyperlipidemia: Secondary | ICD-10-CM

## 2020-11-07 DIAGNOSIS — Z79899 Other long term (current) drug therapy: Secondary | ICD-10-CM | POA: Diagnosis not present

## 2020-11-08 LAB — CBC WITH DIFFERENTIAL/PLATELET
Absolute Monocytes: 391 cells/uL (ref 200–950)
Basophils Absolute: 52 cells/uL (ref 0–200)
Basophils Relative: 1.2 %
Eosinophils Absolute: 271 cells/uL (ref 15–500)
Eosinophils Relative: 6.3 %
HCT: 38.3 % (ref 35.0–45.0)
Hemoglobin: 13.1 g/dL (ref 11.7–15.5)
Lymphs Abs: 1208 cells/uL (ref 850–3900)
MCH: 32 pg (ref 27.0–33.0)
MCHC: 34.2 g/dL (ref 32.0–36.0)
MCV: 93.6 fL (ref 80.0–100.0)
MPV: 10.8 fL (ref 7.5–12.5)
Monocytes Relative: 9.1 %
Neutro Abs: 2378 cells/uL (ref 1500–7800)
Neutrophils Relative %: 55.3 %
Platelets: 213 10*3/uL (ref 140–400)
RBC: 4.09 10*6/uL (ref 3.80–5.10)
RDW: 12.2 % (ref 11.0–15.0)
Total Lymphocyte: 28.1 %
WBC: 4.3 10*3/uL (ref 3.8–10.8)

## 2020-11-08 LAB — LIPID PANEL
Cholesterol: 209 mg/dL — ABNORMAL HIGH (ref <200)
HDL: 63 mg/dL (ref >=50)
LDL Cholesterol (Calc): 121 mg/dL (calc) — ABNORMAL HIGH
Non-HDL Cholesterol (Calc): 146 mg/dL (calc) — ABNORMAL HIGH (ref <130)
Total CHOL/HDL Ratio: 3.3 (calc) (ref <5.0)
Triglycerides: 131 mg/dL (ref <150)

## 2020-11-08 LAB — TSH: TSH: 3.37 mIU/L (ref 0.40–4.50)

## 2020-11-08 LAB — VITAMIN D 25 HYDROXY (VIT D DEFICIENCY, FRACTURES): Vit D, 25-Hydroxy: 50 ng/mL (ref 30–100)

## 2020-11-09 ENCOUNTER — Other Ambulatory Visit: Payer: Self-pay

## 2020-11-09 ENCOUNTER — Ambulatory Visit (INDEPENDENT_AMBULATORY_CARE_PROVIDER_SITE_OTHER): Payer: PPO | Admitting: Internal Medicine

## 2020-11-09 ENCOUNTER — Encounter: Payer: Self-pay | Admitting: Internal Medicine

## 2020-11-09 VITALS — BP 122/70 | HR 71 | Temp 98.6°F | Ht 61.0 in | Wt 157.0 lb

## 2020-11-09 DIAGNOSIS — B009 Herpesviral infection, unspecified: Secondary | ICD-10-CM

## 2020-11-09 DIAGNOSIS — E876 Hypokalemia: Secondary | ICD-10-CM | POA: Diagnosis not present

## 2020-11-09 DIAGNOSIS — E782 Mixed hyperlipidemia: Secondary | ICD-10-CM

## 2020-11-09 DIAGNOSIS — Z800 Family history of malignant neoplasm of digestive organs: Secondary | ICD-10-CM | POA: Diagnosis not present

## 2020-11-09 DIAGNOSIS — Z0000 Encounter for general adult medical examination without abnormal findings: Secondary | ICD-10-CM

## 2020-11-09 DIAGNOSIS — T502X5A Adverse effect of carbonic-anhydrase inhibitors, benzothiadiazides and other diuretics, initial encounter: Secondary | ICD-10-CM

## 2020-11-09 DIAGNOSIS — H8109 Meniere's disease, unspecified ear: Secondary | ICD-10-CM

## 2020-11-09 DIAGNOSIS — J301 Allergic rhinitis due to pollen: Secondary | ICD-10-CM

## 2020-11-09 DIAGNOSIS — Z8616 Personal history of COVID-19: Secondary | ICD-10-CM

## 2020-11-09 DIAGNOSIS — I10 Essential (primary) hypertension: Secondary | ICD-10-CM | POA: Diagnosis not present

## 2020-11-09 LAB — POCT URINALYSIS DIPSTICK
Bilirubin, UA: NEGATIVE
Blood, UA: NEGATIVE
Glucose, UA: NEGATIVE
Ketones, UA: NEGATIVE
Leukocytes, UA: NEGATIVE
Nitrite, UA: NEGATIVE
Protein, UA: NEGATIVE
Spec Grav, UA: 1.01 (ref 1.010–1.025)
Urobilinogen, UA: 0.2 E.U./dL
pH, UA: 5 (ref 5.0–8.0)

## 2020-11-09 MED ORDER — ROSUVASTATIN CALCIUM 20 MG PO TABS: 20.0000 mg | ORAL_TABLET | Freq: Every day | ORAL | 1 refills | Status: DC

## 2020-11-09 NOTE — Progress Notes (Addendum)
Subjective:    Patient ID: Sally Hopkins, female    DOB: Feb 01, 1950, 70 y.o.   MRN: [id number]  HPI  70 year old Female seen for Health maintenance exam, Medicare wellness and evaluation of medical issues.  She has a history of Mnire's disease which is stable and in remission.  History of hyperlipidemia, hypertension, herpes simplex type II.  She takes potassium supplement due to hypokalemia from diuretic therapy.  History of allergic urticaria and allergic rhinitis seen by Dr. Neldon Mc.  Social history: She is married with 2 adult sons.  Does not smoke.  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  Graduate of [employer].  Social alcohol consumption.  Family history: Father died at age 6 of ALS but also had colon cancer.  Mother with history of open heart surgery in 1969 for congenital heart disease and also had CABG.  1 sister in good health.  Patient has declined colonoscopy.  Cologuard has been discussed but she has been reluctant to do that as well.  History of basal cell carcinomas treated by [facility].  History of primary osteoarthritis of left knee seen by Dr. Milon Score.  Patient is allergic to [facility] which has been used as a preservative in vaccines but she may take annual flu vaccines at single dose vials do not contain thimerosal.  She has some concerns about taking future COVID-19 boosters after having a severe reaction after second [place] immunization in February 2021.  She had fever, chills, myalgias, malaise and fatigue that was protracted.  She had a tiny left pleural effusion.  She was seen by Dr. Percival Spanish to rule out myocarditis from the vaccine but he did not feel that she had this.  She tested negative for the flu and negative for COVID-19 at that time.  She had a EKG by Dr. Percival Spanish and echocardiogram both of which were normal.  He did not feel she had myocarditis.  Her symptoms subsequently resolved  spontaneously.    Review of Systems no new complaints     Objective:   Physical Exam Blood pressure 122/70 pulse 71 temperature 98.6 degrees pulse oximetry 98% weight 157 pounds height 5 feet 1 inches BMI 29.66 skin: Warm and dry.  Nodes none.  TMs clear.  Neck supple.  Chest clear.  Cardiac exam: Regular rate and rhythm without ectopy or murmur.  Abdomen is soft nondistended without hepatosplenomegaly masses or tenderness.  No lower extremity pitting edema.  Neuro is intact without gross focal deficits.  Affect thought and judgment are normal.       Assessment & Plan:  History of reaction to COVID-19 vaccine  Family history of colon cancer-needs to consider some form of screening  Allergic rhinitis treated with Zyrtec  Hyperlipidemia treated with statin medication.  This is mixed hyperlipidemia.  Currently total cholesterol is 209 and LDL cholesterol 121.  Hypokalemia secondary to diuretic therapy  Essential hypertension stable on current medications  History of Mnire's disease  Thimerosal allergy  History of anxiety treated sparingly with Xanax  History of HPV and abnormal Pap smears treated by GYN physician  History of HSV type II treated with Valtrex  Plan: She will take Crestor 20 mg daily, continue losartan and metoprolol as well as chlorthalidone for hypertension.  Follow-up in February for hyperlipidemia.  She says she may need to have knee arthroplasty and will be seeing Dr. Alvan Dame soon.   Subjective:   Patient presents for Medicare Annual/Subsequent preventive examination.  Review Past Medical/Family/Social: See above   Risk Factors  Current exercise habits: Plays pickle ball and exercises regularly Dietary issues discussed: Low-fat low carbohydrate  Cardiac risk factors: Hyperlipidemia  Depression Screen  (Note: if answer to either of the following is "Yes", a more complete depression screening is indicated)   Over the past two weeks, have you felt  down, depressed or hopeless? No  Over the past two weeks, have you felt little interest or pleasure in doing things? No Have you lost interest or pleasure in daily life? No Do you often feel hopeless? No Do you cry easily over simple problems? No   Activities of Daily Living  In your present state of health, do you have any difficulty performing the following activities?:   Driving? No  Managing money? No  Feeding yourself? No  Getting from bed to chair? No  Climbing a flight of stairs? No  Preparing food and eating?: No  Bathing or showering? No  Getting dressed: No  Getting to the toilet? No  Using the toilet:No  Moving around from place to place: No  In the past year have you fallen or had a near fall?:No  Are you sexually active?  Yes Do you have more than one partner? No   Hearing Difficulties: No  Do you often ask people to speak up or repeat themselves?  Yes Do you experience ringing or noises in your ears?  Yes Do you have difficulty understanding soft or whispered voices?  Yes Do you feel that you have a problem with memory? No Do you often misplace items? No    Home Safety:  Do you have a smoke alarm at your residence? Yes Do you have grab bars in the bathroom?  No Do you have throw rugs in your house?   Cognitive Testing  Alert? Yes Normal Appearance?Yes  Oriented to person? Yes Place? Yes  Time? Yes  Recall of three objects? Yes  Can perform simple calculations? Yes  Displays appropriate judgment?Yes  Can read the correct time from a watch face?Yes   List the Names of Other Physician/Practitioners you currently use:  See referral list for the physicians patient is currently seeing.   Dr. Alvan Dame, orthopedist  GYN  Review of Systems: See above   Objective:     General appearance: Appears younger than stated age  Head: Normocephalic, without obvious abnormality, atraumatic  Eyes: conj clear, EOMi PEERLA  Ears: normal TM's and external ear canals  both ears  Nose: Nares normal. Septum midline. Mucosa normal. No drainage or sinus tenderness.  Throat: lips, mucosa, and tongue normal; teeth and gums normal  Neck: no adenopathy, no carotid bruit, no JVD, supple, symmetrical, trachea midline and thyroid not enlarged, symmetric, no tenderness/mass/nodules  No CVA tenderness.  Lungs: clear to auscultation bilaterally  Breasts: normal appearance, no masses or tenderness Heart: regular rate and rhythm, S1, S2 normal, no murmur, click, rub or gallop  Abdomen: soft, non-tender; bowel sounds normal; no masses, no organomegaly  Musculoskeletal: ROM normal in all joints, no crepitus, no deformity, Normal muscle strengthen. Back  is symmetric, no curvature. Skin: Skin color, texture, turgor normal. No rashes or lesions  Lymph nodes: Cervical, supraclavicular, and axillary nodes normal.  Neurologic: CN 2 -12 Normal, Normal symmetric reflexes. Normal coordination and gait  Psych: Alert & Oriented x 3, Mood appear stable.    Assessment:    Annual wellness medicare exam   Plan:    During the course of the visit the  patient was educated and counseled about appropriate screening and preventive services including:   Has declined colon cancer screening     Patient Instructions (the written plan) was given to the patient.  Medicare Attestation  I have personally reviewed:  The patient's medical and social history  Their use of alcohol, tobacco or illicit drugs  Their current medications and supplements  The patient's functional ability including ADLs,fall risks, home safety risks, cognitive, and hearing and visual impairment  Diet and physical activities  Evidence for depression or mood disorders  The patient's weight, height, BMI, and visual acuity have been recorded in the chart. I have made referrals, counseling, and provided education to the patient based on review of the above and I have provided the patient with a written personalized care  plan for preventive services.

## 2020-11-09 NOTE — Patient Instructions (Addendum)
Change to Crestor 20 mg daily. Follow up early February.  It was a pleasure to see you today.  Please consider colonoscopy or at least Cologuard screening for colon cancer due to family history.

## 2020-11-20 DIAGNOSIS — M1712 Unilateral primary osteoarthritis, left knee: Secondary | ICD-10-CM | POA: Diagnosis not present

## 2020-11-20 DIAGNOSIS — M170 Bilateral primary osteoarthritis of knee: Secondary | ICD-10-CM | POA: Diagnosis not present

## 2020-12-06 DIAGNOSIS — D225 Melanocytic nevi of trunk: Secondary | ICD-10-CM | POA: Diagnosis not present

## 2020-12-06 DIAGNOSIS — D485 Neoplasm of uncertain behavior of skin: Secondary | ICD-10-CM | POA: Diagnosis not present

## 2020-12-06 DIAGNOSIS — D2261 Melanocytic nevi of right upper limb, including shoulder: Secondary | ICD-10-CM | POA: Diagnosis not present

## 2020-12-06 DIAGNOSIS — D2262 Melanocytic nevi of left upper limb, including shoulder: Secondary | ICD-10-CM | POA: Diagnosis not present

## 2020-12-06 DIAGNOSIS — Z85828 Personal history of other malignant neoplasm of skin: Secondary | ICD-10-CM | POA: Diagnosis not present

## 2020-12-06 DIAGNOSIS — L738 Other specified follicular disorders: Secondary | ICD-10-CM | POA: Diagnosis not present

## 2020-12-06 DIAGNOSIS — L821 Other seborrheic keratosis: Secondary | ICD-10-CM | POA: Diagnosis not present

## 2020-12-06 DIAGNOSIS — D0359 Melanoma in situ of other part of trunk: Secondary | ICD-10-CM | POA: Diagnosis not present

## 2020-12-07 ENCOUNTER — Telehealth: Payer: Self-pay

## 2020-12-07 NOTE — Telephone Encounter (Signed)
I s/w the pt and she is agreeable to plan of care for pre op appt. Pt has been scheduled to see Dr. Percival Spanish 01/17/21 @ 9:40 for pre op clearance. Pt is grateful for the call and the help. I will forward notes to MD for appt in 01/2021. Will send FYI to surgeon's office pt has appt 01/17/21.

## 2020-12-07 NOTE — Telephone Encounter (Signed)
   [facility] Pre-operative Risk Assessment    Patient Name: Sally Hopkins  DOB: 05/03/1950 MRN: [id number]  HEARTCARE STAFF:  - IMPORTANT!!!!!! Under Visit Info/Reason for Call, type in Other and utilize the format Clearance MM/DD/YY or Clearance TBD. Do not use dashes or single digits. - Please review there is not already an duplicate clearance open for this procedure. - If request is for dental extraction, please clarify the # of teeth to be extracted. - If the patient is currently at the dentist's office, call Pre-Op Callback Staff (MA/nurse) to input urgent request.  - If the patient is not currently in the dentist office, please route to the Pre-Op pool.  Request for surgical clearance:  What type of surgery is being performed? RIGHT TOTAL KNEE ARTHROPLASTY  When is this surgery scheduled? 01-29-2021  What type of clearance is required (medical clearance vs. Pharmacy clearance to hold med vs. Both)? MEDICAL  Are there any medications that need to be held prior to surgery and how long? NONE LISTED  Practice name and name of physician performing surgery? [facility]  What is the office phone number? [phone]   7.   What is the office fax number? [phone]  8.   Anesthesia type (None, local, MAC, general) ? SPINAL

## 2020-12-07 NOTE — Telephone Encounter (Signed)
   Name: Sally Hopkins  DOB: [birth year]  MRN: [id number]  Primary Cardiologist: Dr. Percival Spanish  Chart reviewed as part of pre-operative protocol coverage. Patient has not been seen in over 1 year (since 06/2019). Because of Sherece Gambrill Ecklund's past medical history and time since last visit, she will require a follow-up visit in order to better assess preoperative cardiovascular risk.  Pre-op covering staff: - Please schedule appointment and call patient to inform them. If patient already had an upcoming appointment within acceptable timeframe, please add "pre-op clearance" to the appointment notes so provider is aware. - Please contact requesting surgeon's office via preferred method (i.e, phone, fax) to inform them of need for appointment prior to surgery.  If applicable, this message will also be routed to pharmacy pool and/or primary cardiologist for input on holding anticoagulant/antiplatelet agent as requested below so that this information is available to the clearing provider at time of patient's appointment.   Darreld Mclean, PA-C  12/07/2020, 5:13 PM

## 2020-12-22 ENCOUNTER — Other Ambulatory Visit: Payer: Self-pay | Admitting: Internal Medicine

## 2020-12-22 DIAGNOSIS — Z1231 Encounter for screening mammogram for malignant neoplasm of breast: Secondary | ICD-10-CM | POA: Diagnosis not present

## 2020-12-27 DIAGNOSIS — D0359 Melanoma in situ of other part of trunk: Secondary | ICD-10-CM | POA: Diagnosis not present

## 2020-12-27 DIAGNOSIS — Z85828 Personal history of other malignant neoplasm of skin: Secondary | ICD-10-CM | POA: Diagnosis not present

## 2020-12-27 DIAGNOSIS — D0352 Melanoma in situ of breast (skin) (soft tissue): Secondary | ICD-10-CM | POA: Diagnosis not present

## 2021-01-11 ENCOUNTER — Encounter: Payer: Self-pay | Admitting: Internal Medicine

## 2021-01-15 ENCOUNTER — Other Ambulatory Visit: Payer: PPO | Admitting: Internal Medicine

## 2021-01-15 ENCOUNTER — Other Ambulatory Visit: Payer: Self-pay

## 2021-01-15 DIAGNOSIS — Z01818 Encounter for other preprocedural examination: Secondary | ICD-10-CM | POA: Diagnosis not present

## 2021-01-15 DIAGNOSIS — I517 Cardiomegaly: Secondary | ICD-10-CM | POA: Insufficient documentation

## 2021-01-15 DIAGNOSIS — R7301 Impaired fasting glucose: Secondary | ICD-10-CM | POA: Diagnosis not present

## 2021-01-15 DIAGNOSIS — Z01810 Encounter for preprocedural cardiovascular examination: Secondary | ICD-10-CM | POA: Diagnosis not present

## 2021-01-15 NOTE — Progress Notes (Signed)
Cardiology Office Note   Date:  01/17/2021   ID:  Sally Hopkins, DOB Feb 18, 1950, MRN [id number]  PCP:  Elby Showers, MD  Cardiologist:   None Referring:  Elby Showers, MD   Chief Complaint  Patient presents with   Pre-op Exam      History of Present Illness: Sally Hopkins is a 71 y.o. female who is referred by Elby Showers, MD for evaluation of a possible COVID 19 vaccine reaction.  Dr. Renold Genta thought she might have a cardiac involvement.  Echo was unremarkable except for moderate LVH.  She is getting ready to have right knee replacement.  She has not been particularly active because of this though she does her chores of daily living. The patient denies any new symptoms such as chest discomfort, neck or arm discomfort. There has been no new shortness of breath, PND or orthopnea. There have been no reported palpitations, presyncope or syncope.     Past Medical History:  Diagnosis Date   Dyslipidemia    HSV-1 infection    Hypertension    Meniere's disease     Past Surgical History:  Procedure Laterality Date   Cervical cryosurgery     NASAL SEPTUM SURGERY       Current Outpatient Medications  Medication Sig Dispense Refill   ALPRAZolam (XANAX) 0.25 MG tablet Take 1 tablet (0.25 mg total) by mouth as needed for anxiety. (Patient not taking: Reported on 01/16/2021) 30 tablet 5   cetirizine (ZYRTEC) 10 MG tablet Take 10 mg by mouth daily.     chlorthalidone (HYGROTON) 25 MG tablet TAKE ONE TABLET BY MOUTH ONCE DAILY 90 tablet 0   Cholecalciferol (VITAMIN D3) 50 MCG (2000 UT) CAPS Take 2,000 Units by mouth daily.     Coenzyme Q10 (CO Q-10 PO) Take 1 capsule by mouth daily.     COLLAGEN PO Take by mouth.     glucosamine-chondroitin 500-400 MG tablet Take 1 tablet by mouth 3 (three) times daily.     losartan (COZAAR) 100 MG tablet TAKE ONE TABLET BY MOUTH DAILY 90 tablet 0   metoprolol succinate (TOPROL-XL) 50 MG 24 hr tablet TAKE ONE TABLET BY MOUTH ONCE DAILY  AFTER MEALS 90 tablet 0   Multiple Vitamin (MULTIVITAMIN) capsule Take 1 capsule by mouth daily.     NON FORMULARY Slim Microbiome activating Green coffee bean, garcinia cambogia, mulberry extract, alpha lipoic acid     potassium chloride SA (KLOR-CON M) 20 MEQ tablet TAKE ONE TABLET BY MOUTH TWICE DAILY 180 tablet 0   rosuvastatin (CRESTOR) 20 MG tablet Take 1 tablet (20 mg total) by mouth daily. 90 tablet 1   valACYclovir (VALTREX) 500 MG tablet TAKE ONE TABLET TWICE A DAY. 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 (Patient taking differently: Take 500 mg by mouth daily.) 180 tablet 1   vitamin C (ASCORBIC ACID) 500 MG tablet Take 500 mg by mouth in the morning and at bedtime.     zinc gluconate 50 MG tablet Take 50 mg by mouth daily.     No current facility-administered medications for this visit.    Allergies:   Erythromycin, Macrobid  [nitrofurantoin macrocrystal], Sulfa antibiotics, Penicillins, and Thimerosol [thimerosal]    ROS:  Please see the history of present illness.   Otherwise, review of systems are positive for none.   All other systems are reviewed and negative.    PHYSICAL EXAM: VS:  BP 120/70 (BP Location:  Left Arm, Patient Position: Sitting)    Pulse 72    Ht 5\' 2"  (1.575 m)    Wt 160 lb 3.2 oz (72.7 kg)    SpO2 99%    BMI 29.30 kg/m  , BMI Body mass index is 29.3 kg/m. GENERAL:  Well appearing NECK:  No jugular venous distention, waveform within normal limits, carotid upstroke brisk and symmetric, no bruits, no thyromegaly LUNGS:  Clear to auscultation bilaterally CHEST:  Unremarkable HEART:  PMI not displaced or sustained,S1 and S2 within normal limits, no S3, no S4, no clicks, no rubs, no murmurs ABD:  Flat, positive bowel sounds normal in frequency in pitch, no bruits, no rebound, no guarding, no midline pulsatile mass, no hepatomegaly, no splenomegaly EXT:  2 plus pulses throughout, no edema, no cyanosis no clubbing    EKG:  EKG is   ordered  today. Normal sinus rhythm, right bundle branch block, rate 72, axis within normal limits, intervals within normal limits, no acute ST-T wave changes.  No change from previous.  Recent Labs: 05/12/2020: ALT 18 11/07/2020: TSH 3.37 01/15/2021: BUN 26; Creat 0.86; Hemoglobin 13.3; Platelets 205; Potassium 4.3; Sodium 143    Lipid Panel    Component Value Date/Time   CHOL 209 (H) 11/07/2020 1009   TRIG 131 11/07/2020 1009   HDL 63 11/07/2020 1009   CHOLHDL 3.3 11/07/2020 1009   VLDL 28 10/27/2015 0927   LDLCALC 121 (H) 11/07/2020 1009      Wt Readings from Last 3 Encounters:  01/17/21 160 lb 3.2 oz (72.7 kg)  01/16/21 159 lb (72.1 kg)  11/09/20 157 lb (71.2 kg)      Other studies Reviewed: Additional studies/ records that were reviewed today include: Labs. Review of the above records demonstrates: See elsewhere  ASSESSMENT AND PLAN:  RBBB:    This is chronic.  I did review this with her again today.  LVH:    This was moderate on echo in June.  I will follow this up in June 2024.  I suspect this is related to her hypertension.  HTN: Her blood pressure is currently well controlled.  No change in therapy.  PREOP:   The patient has no high risk findings.  Despite her knee pain she has a high functional level.  She is not going for high risk surgery.  Therefore, according to ACC/AHA guidelines the patient is at acceptable risk for the planned surgery.   Current medicines are reviewed at length with the patient today.  The patient does not have concerns regarding medicines.  The following changes have been made: None  Labs/ tests ordered today include:       Orders Placed This Encounter  Procedures   EKG 12-Lead   ECHOCARDIOGRAM COMPLETE     Disposition:   FU with me after the echo in June of next year   Signed, Minus Breeding, MD  01/17/2021 10:25 AM    [place]

## 2021-01-16 ENCOUNTER — Ambulatory Visit (INDEPENDENT_AMBULATORY_CARE_PROVIDER_SITE_OTHER): Payer: PPO | Admitting: Internal Medicine

## 2021-01-16 ENCOUNTER — Encounter: Payer: Self-pay | Admitting: Internal Medicine

## 2021-01-16 VITALS — BP 124/64 | HR 69 | Temp 97.7°F | Ht 60.5 in | Wt 159.0 lb

## 2021-01-16 DIAGNOSIS — Z800 Family history of malignant neoplasm of digestive organs: Secondary | ICD-10-CM

## 2021-01-16 DIAGNOSIS — E782 Mixed hyperlipidemia: Secondary | ICD-10-CM

## 2021-01-16 DIAGNOSIS — E7800 Pure hypercholesterolemia, unspecified: Secondary | ICD-10-CM | POA: Diagnosis not present

## 2021-01-16 DIAGNOSIS — Z85820 Personal history of malignant melanoma of skin: Secondary | ICD-10-CM | POA: Diagnosis not present

## 2021-01-16 DIAGNOSIS — Z01818 Encounter for other preprocedural examination: Secondary | ICD-10-CM | POA: Diagnosis not present

## 2021-01-16 DIAGNOSIS — I10 Essential (primary) hypertension: Secondary | ICD-10-CM | POA: Diagnosis not present

## 2021-01-16 DIAGNOSIS — Z9889 Other specified postprocedural states: Secondary | ICD-10-CM | POA: Diagnosis not present

## 2021-01-16 DIAGNOSIS — M1711 Unilateral primary osteoarthritis, right knee: Secondary | ICD-10-CM

## 2021-01-16 DIAGNOSIS — R7302 Impaired glucose tolerance (oral): Secondary | ICD-10-CM

## 2021-01-16 LAB — BASIC METABOLIC PANEL
BUN/Creatinine Ratio: 30 (calc) — ABNORMAL HIGH (ref 6–22)
BUN: 26 mg/dL — ABNORMAL HIGH (ref 7–25)
CO2: 24 mmol/L (ref 20–32)
Calcium: 9.7 mg/dL (ref 8.6–10.4)
Chloride: 108 mmol/L (ref 98–110)
Creat: 0.86 mg/dL (ref 0.50–1.05)
Glucose, Bld: 112 mg/dL — ABNORMAL HIGH (ref 65–99)
Potassium: 4.3 mmol/L (ref 3.5–5.3)
Sodium: 143 mmol/L (ref 135–146)

## 2021-01-16 LAB — CBC WITH DIFFERENTIAL/PLATELET
Absolute Monocytes: 423 cells/uL (ref 200–950)
Basophils Absolute: 41 cells/uL (ref 0–200)
Basophils Relative: 0.7 %
Eosinophils Absolute: 354 cells/uL (ref 15–500)
Eosinophils Relative: 6.1 %
HCT: 39.6 % (ref 35.0–45.0)
Hemoglobin: 13.3 g/dL (ref 11.7–15.5)
Lymphs Abs: 1711 cells/uL (ref 850–3900)
MCH: 31.7 pg (ref 27.0–33.0)
MCHC: 33.6 g/dL (ref 32.0–36.0)
MCV: 94.5 fL (ref 80.0–100.0)
MPV: 10.7 fL (ref 7.5–12.5)
Monocytes Relative: 7.3 %
Neutro Abs: 3271 cells/uL (ref 1500–7800)
Neutrophils Relative %: 56.4 %
Platelets: 205 10*3/uL (ref 140–400)
RBC: 4.19 10*6/uL (ref 3.80–5.10)
RDW: 12.4 % (ref 11.0–15.0)
Total Lymphocyte: 29.5 %
WBC: 5.8 10*3/uL (ref 3.8–10.8)

## 2021-01-16 LAB — ALBUMIN: Albumin: 4.2 g/dL (ref 3.6–5.1)

## 2021-01-16 LAB — HEMOGLOBIN A1C
Hgb A1c MFr Bld: 5.8 % of total Hgb — ABNORMAL HIGH (ref <5.7)
Mean Plasma Glucose: 120 mg/dL
eAG (mmol/L): 6.6 mmol/L

## 2021-01-16 LAB — PROTIME-INR
INR: 1
Prothrombin Time: 10 s (ref 9.0–11.5)

## 2021-01-16 NOTE — Patient Instructions (Signed)
DUE TO COVID-19 ONLY ONE VISITOR IS ALLOWED TO COME WITH YOU AND STAY IN THE WAITING ROOM ONLY DURING PRE OP AND PROCEDURE DAY OF SURGERY IF YOU ARE GOING HOME AFTER SURGERY. IF YOU ARE SPENDING THE NIGHT 2 PEOPLE MAY VISIT WITH YOU IN YOUR PRIVATE ROOM AFTER SURGERY UNTIL VISITING  HOURS ARE OVER AT 800 PM AND 1  VISITOR  MAY  SPEND THE NIGHT.                 Sally Hopkins     Your procedure is scheduled on: 01/30/21   Report to [facility] Main  Entrance   Report to short stay at 5:15 AM     Call this number if you have problems the morning of surgery [phone]   No food after midnight.    You may have clear liquid until 4:30 AM.    At 4:15 AM drink pre surgery drink.   Nothing by mouth after 4:30 AM.    CLEAR LIQUID DIET   Foods Allowed                                                                     Foods Excluded  Coffee and tea, regular and decaf                             liquids that you cannot  Plain Jell-O any favor except red or purple                                           see through such as: Fruit ices (not with fruit pulp)                                     milk, soups, orange juice  Iced Popsicles                                    All solid food Carbonated beverages, regular and diet                                    Cranberry, grape and apple juices Sports drinks like Gatorade Lightly seasoned clear broth or consume(fat free) Sugar    BRUSH YOUR TEETH MORNING OF SURGERY AND RINSE YOUR MOUTH OUT, NO CHEWING GUM CANDY OR MINTS.     Take these medicines the morning of surgery with A SIP OF WATER: Metoprolol                                You may not have any metal on your body including hair pins and              piercings  Do not wear jewelry, make-up, lotions, powders or perfumes, deodorant  Do not wear nail polish on your fingernails.  Do not shave  48 hours prior to surgery.                 Do not bring valuables  to the hospital. [place].  Contacts, dentures or bridgework may not be worn into surgery.  Leave suitcase in the car. After surgery it may be brought to your room.     Patients discharged the day of surgery will not be allowed to drive home.   IF YOU ARE HAVING SURGERY AND GOING HOME THE SAME DAY, YOU MUST HAVE AN ADULT TO DRIVE YOU HOME AND BE WITH YOU FOR 24 HOURS. YOU MAY GO HOME BY TAXI OR UBER OR ORTHERWISE, BUT AN ADULT MUST ACCOMPANY YOU HOME AND STAY WITH YOU FOR 24 HOURS.  Name and phone number of your driver:  Special Instructions: N/A              Please read over the following fact sheets you were given: _____________________________________________________________________             [facility] - Preparing for Surgery Before surgery, you can play an important role.  Because skin is not sterile, your skin needs to be as free of germs as possible.  You can reduce the number of germs on your skin by washing with CHG (chlorahexidine gluconate) soap before surgery.  CHG is an antiseptic cleaner which kills germs and bonds with the skin to continue killing germs even after washing. Please DO NOT use if you have an allergy to CHG or antibacterial soaps.  If your skin becomes reddened/irritated stop using the CHG and inform your nurse when you arrive at Short Stay. Do not shave (including legs and underarms) for at least 48 hours prior to the first CHG shower.   Please follow these instructions carefully:  1.  Shower with CHG Soap the night before surgery and the  morning of Surgery.  2.  If you choose to wash your hair, wash your hair first as usual with your  normal  shampoo.  3.  After you shampoo, rinse your hair and body thoroughly to remove the  shampoo.                            4.  Use CHG as you would any other liquid soap.  You can apply chg directly  to the skin and wash                       Gently with a scrungie or  clean washcloth.  5.  Apply the CHG Soap to your body ONLY FROM THE NECK DOWN.   Do not use on face/ open                           Wound or open sores. Avoid contact with eyes, ears mouth and genitals (private parts).                       Wash face,  Genitals (private parts) with your normal soap.             6.  Wash thoroughly, paying special attention to the area where your surgery  will be performed.  7.  Thoroughly rinse your body with warm water from the neck down.  8.  DO NOT shower/wash with your normal soap after using and rinsing off  the CHG Soap.             9.  Pat yourself dry with a clean towel.            10.  Wear clean pajamas.            11.  Place clean sheets on your bed the night of your first shower and do not  sleep with pets. Day of Surgery : Do not apply any lotions/deodorants the morning of surgery.  Please wear clean clothes to the hospital/surgery center.  FAILURE TO FOLLOW THESE INSTRUCTIONS MAY RESULT IN THE CANCELLATION OF YOUR SURGERY PATIENT SIGNATURE_________________________________  NURSE SIGNATURE__________________________________  ________________________________________________________________________   Adam Phenix  An incentive spirometer is a tool that can help keep your lungs clear and active. This tool measures how well you are filling your lungs with each breath. Taking long deep breaths may help reverse or decrease the chance of developing breathing (pulmonary) problems (especially infection) following: A long period of time when you are unable to move or be active. BEFORE THE PROCEDURE  If the spirometer includes an indicator to show your best effort, your nurse or respiratory therapist will set it to a desired goal. If possible, sit up straight or lean slightly forward. Try not to slouch. Hold the incentive spirometer in an upright position. INSTRUCTIONS FOR USE  Sit on the edge of your bed if possible, or sit up as far as you can  in bed or on a chair. Hold the incentive spirometer in an upright position. Breathe out normally. Place the mouthpiece in your mouth and seal your lips tightly around it. Breathe in slowly and as deeply as possible, raising the piston or the ball toward the top of the column. Hold your breath for 3-5 seconds or for as long as possible. Allow the piston or ball to fall to the bottom of the column. Remove the mouthpiece from your mouth and breathe out normally. Rest for a few seconds and repeat Steps 1 through 7 at least 10 times every 1-2 hours when you are awake. Take your time and take a few normal breaths between deep breaths. The spirometer may include an indicator to show your best effort. Use the indicator as a goal to work toward during each repetition. After each set of 10 deep breaths, practice coughing to be sure your lungs are clear. If you have an incision (the cut made at the time of surgery), support your incision when coughing by placing a pillow or rolled up towels firmly against it. Once you are able to get out of bed, walk around indoors and cough well. You may stop using the incentive spirometer when instructed by your caregiver.  RISKS AND COMPLICATIONS Take your time so you do not get dizzy or light-headed. If you are in pain, you may need to take or ask for pain medication before doing incentive spirometry. It is harder to take a deep breath if you are having pain. AFTER USE Rest and breathe slowly and easily. It can be helpful to keep track of a log of your progress. Your caregiver can provide you with a simple table to help with this. If you are using the spirometer at home, follow these instructions: [place] IF:  You are having difficultly using the spirometer. You have trouble using  the spirometer as often as instructed. Your pain medication is not giving enough relief while using the spirometer. You develop fever of 100.5 F (38.1 C) or higher. SEEK  IMMEDIATE MEDICAL CARE IF:  You cough up bloody sputum that had not been present before. You develop fever of 102 F (38.9 C) or greater. You develop worsening pain at or near the incision site. MAKE SURE YOU:  Understand these instructions. Will watch your condition. Will get help right away if you are not doing well or get worse. Document Released: 05/06/2006 Document Revised: 03/18/2011 Document Reviewed: 07/07/2006 [facility] Patient Information 2014 [place], [place].   ________________________________________________________________________

## 2021-01-16 NOTE — Progress Notes (Signed)
° °  Subjective:    Patient ID: Sally Hopkins, female    DOB: Dec 18, 1950, 72 y.o.   MRN: [id number]  HPI 71 year old Female seen for Pre-operative evaluation for right knee arthroplasty by Dr. Alvan Dame on January 24th. Has recently injured left knee also.Pain in right knee with moving around is severe. Can't stand for more than 5 minutes without pain.  Hgb AIC is 5.8% likely elevated due to decreased activity.B-met is WNL. Protime is WNL.Creatinine on January 9 is normal at 0.86.  To see Dr.Hochrein tomorrow for CV clearance.  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 Wilhemina Bonito. There is a stich remianing in the sternal area from excision that was removed by me today.  She had Medicare wellness visit in November and health maintenance exam.  History of Mnire's disease which is stable and in remission.  She has a history of hyperlipidemia, hypertension and takes potassium supplement due to hypokalemia from diuretic therapy.  History of allergic urticaria and allergic rhinitis seen by Dr. Neldon Mc.  Has declined colonoscopy and Cologuard.  After second [place] immunization in February 2021 she had fever, chills, myalgias malaise and fatigue that was protracted.  She had a tiny left pleural effusion.  She saw Dr. Percival Spanish to rule out myocarditis from the vaccine but he did not feel she had this.  She tested negative for the flu and negative for COVID-19 at the time.  EKG and echocardiogram done by Dr. Percival Spanish were both normal.  Her symptoms subsequently resolved spontaneously.  Family history: Father died at age 54 of ALS but also had colon cancer.  Mother with history of open heart surgery 1969 for congenital heart disease and also had CABG.  1 sister in good health.  Social history: Married with 2 adult sons.  Does not smoke.  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  Social alcohol consumption.   Review of Systems no chest pain or SOB. No palpitations.      Objective:   Physical Exam BP 124/64 pulse 69 T 97.7 weight 159 pounds Skin warm and dry.  No cervical adenopathy.  No carotid bruits.  No thyromegaly.  Chest is clear to auscultation.  Cardiac exam: Regular rate and rhythm without ectopy.  No lower extremity pitting edema.  Stitch removed from sternal area from recent Dermatology excision for melanoma.  Area is well-healed.      Assessment & Plan:  End-stage osteoarthritis of right knee-to have right TKA jan 24th  Stitch removal from sternal area from where a melanoma lesion was excised by Dermatologist recently  Impaired glucose tolerance-may improve after she is more physically active after surgery.  Continue to monitor hemoglobin A1c.  Essential hypertension-stable on losartan and metoprolol as well as chlorthalidone   Pure hypercholesterolemia-currently on Crestor 20 mg daily.  If she is more active this may improve  Plan: Medically cleared for right TKA by Dr. Alvan Dame in late January.  To see Dr. Percival Spanish tomorrow for cardiac clearance.

## 2021-01-16 NOTE — Patient Instructions (Addendum)
It was a pleasure to see you today. Labs reviewed are WNL. Will see Dr. Percival Spanish tomorrow for Cardiac clearance for right TKA.  She has follow-up appointment here for hyperlipidemia in February

## 2021-01-17 ENCOUNTER — Encounter (HOSPITAL_COMMUNITY): Payer: Self-pay

## 2021-01-17 ENCOUNTER — Encounter (HOSPITAL_COMMUNITY)
Admission: RE | Admit: 2021-01-17 | Discharge: 2021-01-17 | Disposition: A | Discharge status: Home / Self Care | Payer: PPO | Source: Ambulatory Visit | Attending: Orthopedic Surgery | Admitting: Orthopedic Surgery

## 2021-01-17 ENCOUNTER — Ambulatory Visit (INDEPENDENT_AMBULATORY_CARE_PROVIDER_SITE_OTHER): Payer: PPO | Admitting: Cardiology

## 2021-01-17 ENCOUNTER — Encounter: Payer: Self-pay | Admitting: Cardiology

## 2021-01-17 ENCOUNTER — Other Ambulatory Visit: Payer: Self-pay

## 2021-01-17 VITALS — BP 120/70 | HR 72 | Ht 62.0 in | Wt 160.2 lb

## 2021-01-17 VITALS — BP 142/74 | HR 79 | Temp 98.0°F | Resp 18 | Ht 62.0 in | Wt 160.0 lb

## 2021-01-17 DIAGNOSIS — Z01818 Encounter for other preprocedural examination: Secondary | ICD-10-CM | POA: Diagnosis not present

## 2021-01-17 DIAGNOSIS — M1711 Unilateral primary osteoarthritis, right knee: Secondary | ICD-10-CM | POA: Insufficient documentation

## 2021-01-17 DIAGNOSIS — R072 Precordial pain: Secondary | ICD-10-CM

## 2021-01-17 DIAGNOSIS — I517 Cardiomegaly: Secondary | ICD-10-CM

## 2021-01-17 DIAGNOSIS — Z01810 Encounter for preprocedural cardiovascular examination: Secondary | ICD-10-CM

## 2021-01-17 DIAGNOSIS — M25562 Pain in left knee: Secondary | ICD-10-CM | POA: Diagnosis not present

## 2021-01-17 HISTORY — DX: Malignant (primary) neoplasm, unspecified: C80.1

## 2021-01-17 LAB — COMPREHENSIVE METABOLIC PANEL
ALT: 25 U/L (ref 0–44)
AST: 20 U/L (ref 15–41)
Albumin: 4.3 g/dL (ref 3.5–5.0)
Alkaline Phosphatase: 42 U/L (ref 38–126)
Anion gap: 7 (ref 5–15)
BUN: 29 mg/dL — ABNORMAL HIGH (ref 8–23)
CO2: 25 mmol/L (ref 22–32)
Calcium: 9.7 mg/dL (ref 8.9–10.3)
Chloride: 107 mmol/L (ref 98–111)
Creatinine, Ser: 1.02 mg/dL — ABNORMAL HIGH (ref 0.44–1.00)
GFR, Estimated: 60 mL/min — ABNORMAL LOW (ref >60)
Glucose, Bld: 108 mg/dL — ABNORMAL HIGH (ref 70–99)
Potassium: 3.7 mmol/L (ref 3.5–5.1)
Sodium: 139 mmol/L (ref 135–145)
Total Bilirubin: 0.7 mg/dL (ref 0.3–1.2)
Total Protein: 7.3 g/dL (ref 6.5–8.1)

## 2021-01-17 LAB — TYPE AND SCREEN
ABO/RH(D): A POS
Antibody Screen: NEGATIVE

## 2021-01-17 LAB — SURGICAL PCR SCREEN
MRSA, PCR: NEGATIVE
Staphylococcus aureus: NEGATIVE

## 2021-01-17 NOTE — Patient Instructions (Signed)
Medication Instructions:  Your physician recommends that you continue on your current medications as directed. Please refer to the Current Medication list given to you today.  *If you need a refill on your cardiac medications before your next appointment, please call your pharmacy*  Testing/Procedures: Echocardiogram in June 2024 @ [place] [employer]   Follow-Up: At [facility], you and your health needs are our priority.  As part of our continuing mission to provide you with exceptional heart care, we have created designated Provider Care Teams.  These Care Teams include your primary Cardiologist (physician) and Advanced Practice Providers (APPs -  Physician Assistants and Nurse Practitioners) who all work together to provide you with the care you need, when you need it.  We recommend signing up for the patient portal called "MyChart".  Sign up information is provided on this After Visit Summary.  MyChart is used to connect with patients for Virtual Visits (Telemedicine).  Patients are able to view lab/test results, encounter notes, upcoming appointments, etc.  Non-urgent messages can be sent to your provider as well.   To learn more about what you can do with MyChart, go to [email or web address].    Your next appointment:   June 2024 with Dr. Percival Spanish  ** call in Jan 2024 for this appointment

## 2021-01-17 NOTE — Progress Notes (Addendum)
COVID test- NA  PCP - Dr. Jerilynn Mages. [place] Cardiologist - Dr. Vita Barley  Chest x-ray - no EKG - 01/17/21-epic Stress Test - no ECHO - 06/26/20-epic Cardiac Cath - na Pacemaker/ICD device last checked:NA  Sleep Study - no CPAP -   Fasting Blood Sugar - no Checks Blood Sugar _____ times a day  Blood Thinner Instructions:no Aspirin Instructions: Last Dose:  Anesthesia review: yes  Patient denies shortness of breath, fever, cough and chest pain at PAT appointment No SOB with any activities  Patient verbalized understanding of instructions that were given to them at the PAT appointment. Patient was also instructed that they will need to review over the PAT instructions again at home before surgery. yes

## 2021-01-23 IMAGING — CR DG CHEST 2V
2 series · 2 of 2 positions shown · non-contrast
Comparison: 03/01/2019

CLINICAL DATA: Symptoms after second COVID vaccine.

EXAM:
CHEST - 2 VIEW

[w chest pa]
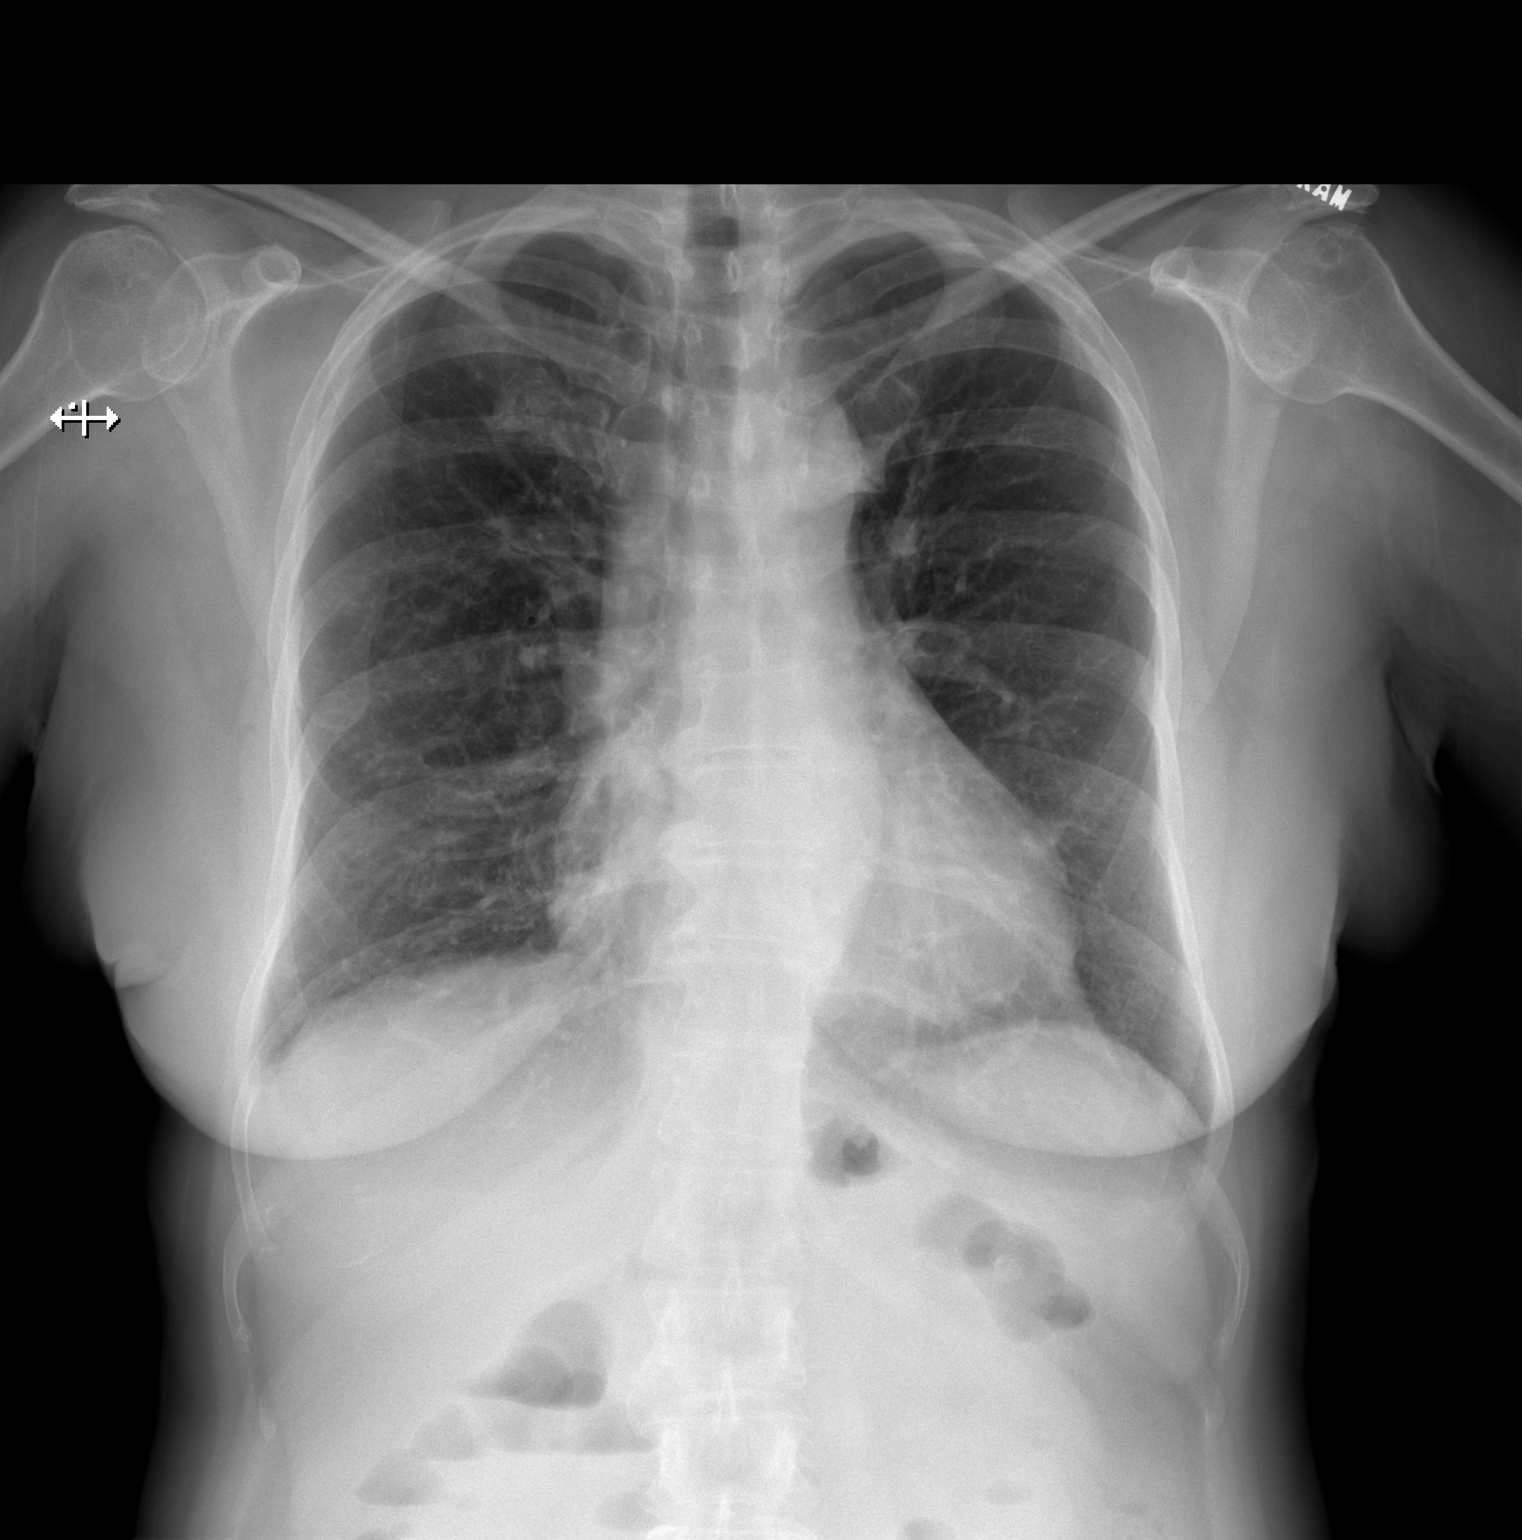

[w chest lat]
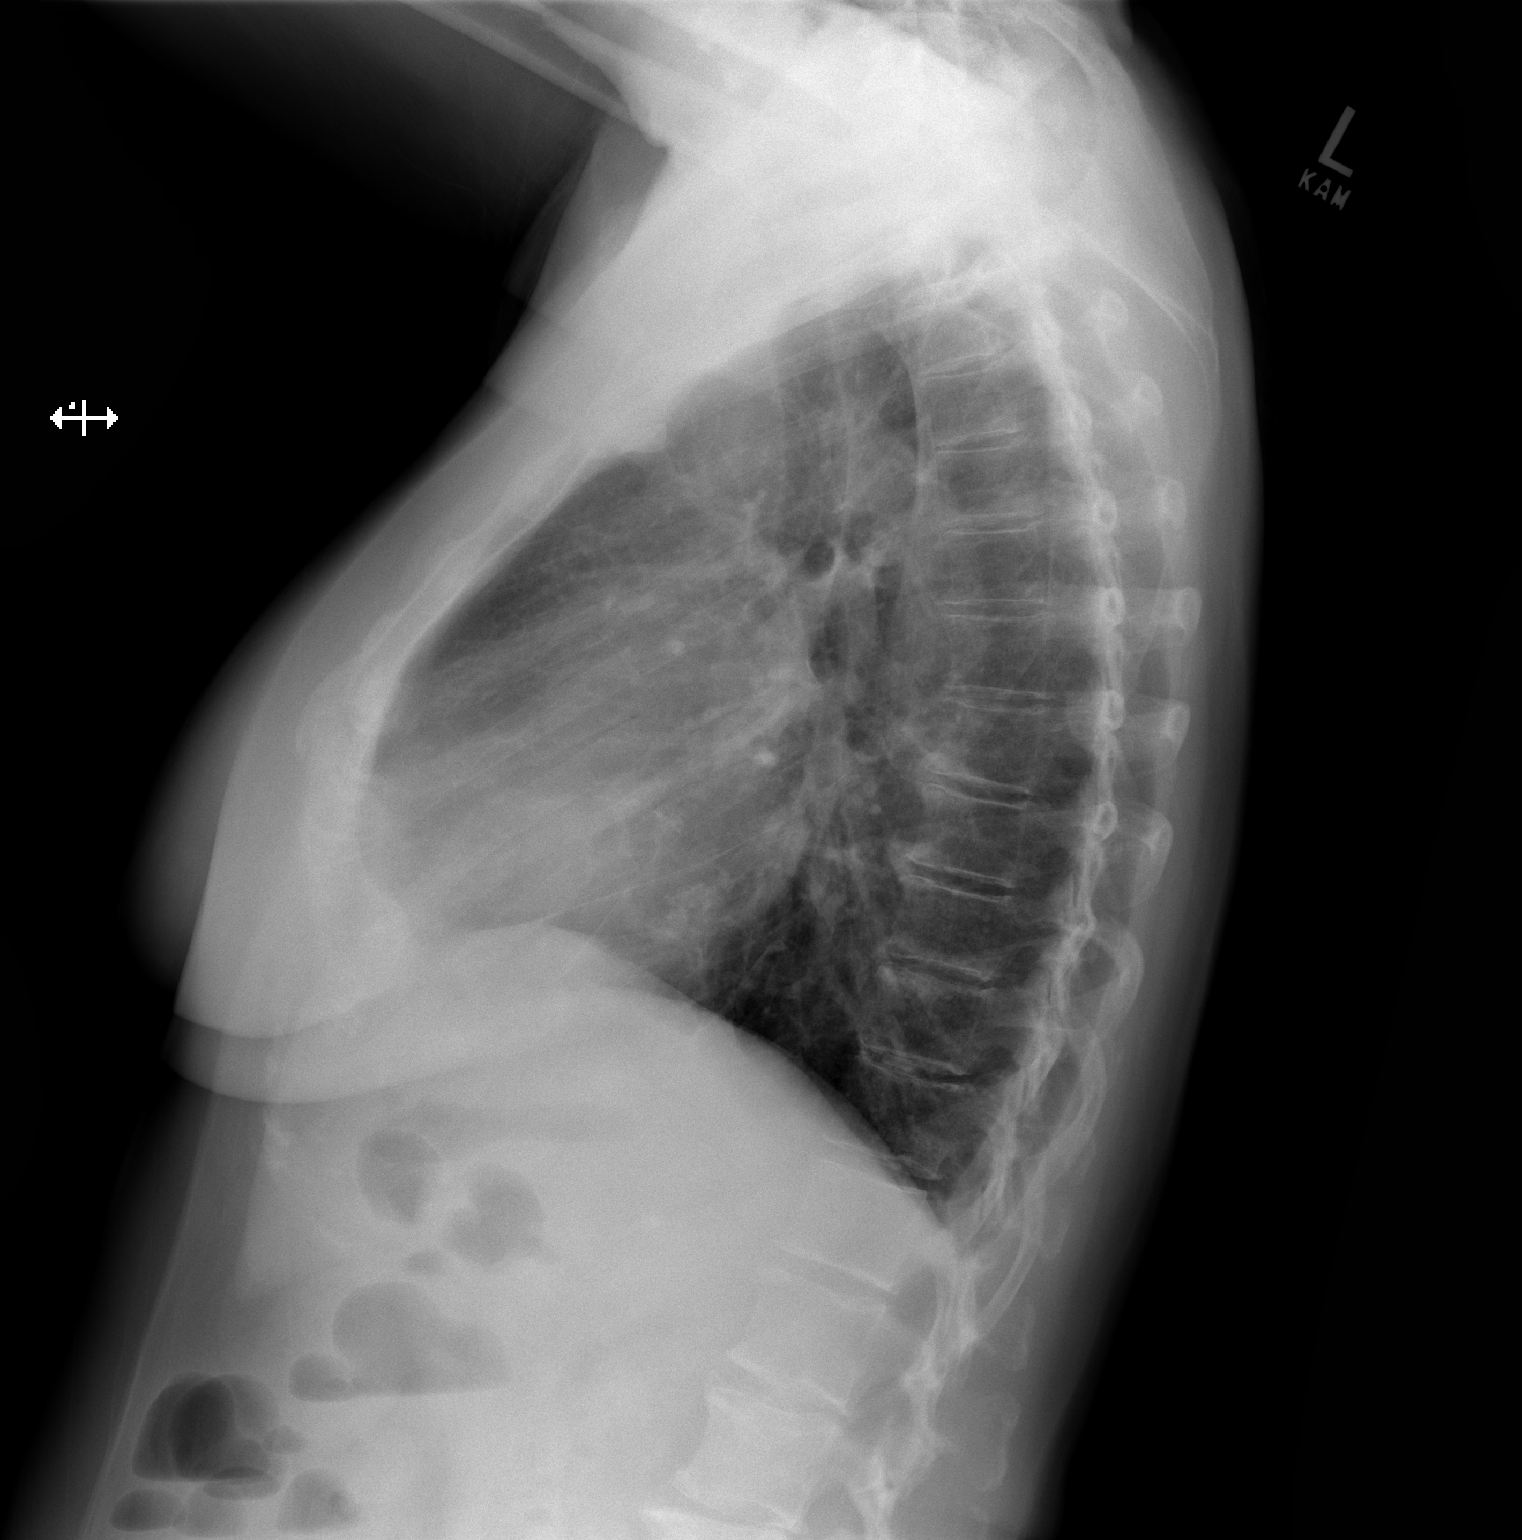

[2 of 2 positions shown; findings below may reference images not displayed]

FINDINGS: Lungs are adequately inflated without focal airspace consolidation
or effusion. Cardiomediastinal silhouette and remainder of the exam
is unchanged.
IMPRESSION: No active cardiopulmonary disease.

## 2021-01-29 NOTE — Anesthesia Preprocedure Evaluation (Addendum)
Anesthesia Evaluation  Patient identified by MRN, date of birth, ID band Patient awake    Reviewed: Allergy & Precautions, H&P , NPO status , Patient's Chart, lab work & pertinent test results  Airway Mallampati: III  TM Distance: >3 FB Neck ROM: Full    Dental no notable dental hx. (+) Teeth Intact, Dental Advisory Given   Pulmonary neg pulmonary ROS,    Pulmonary exam normal breath sounds clear to auscultation       Cardiovascular Exercise Tolerance: Good hypertension, Pt. on medications and Pt. on home beta blockers negative cardio ROS Normal cardiovascular exam Rhythm:Regular Rate:Normal  ECHO 6/22 Left ventricular ejection fraction, by estimation, is 65 to 70%. The left ventricle has normal function. The left ventricle has no regional wall motion abnormalities. There is moderate concentric left ventricular hypertrophy. Left ventricular diastolic parameters were normal.    Neuro/Psych negative neurological ROS  negative psych ROS   GI/Hepatic negative GI ROS, Neg liver ROS,   Endo/Other  negative endocrine ROS  Renal/GU negative Renal ROS  negative genitourinary   Musculoskeletal negative musculoskeletal ROS (+) Arthritis , Osteoarthritis,    Abdominal   Peds negative pediatric ROS (+)  Hematology negative hematology ROS (+)   Anesthesia Other Findings   Reproductive/Obstetrics negative OB ROS                            Anesthesia Physical Anesthesia Plan  ASA: 2  Anesthesia Plan: Spinal and Regional   Post-op Pain Management:    Induction:   PONV Risk Score and Plan: 2 and Ondansetron, Treatment may vary due to age or medical condition, Propofol infusion and Midazolam  Airway Management Planned: Nasal Cannula, Natural Airway and Simple Face Mask  Additional Equipment: None  Intra-op Plan:   Post-operative Plan:   Informed Consent: I have reviewed the patients History  and Physical, chart, labs and discussed the procedure including the risks, benefits and alternatives for the proposed anesthesia with the patient or authorized representative who has indicated his/her understanding and acceptance.       Plan Discussed with: Anesthesiologist and CRNA  Anesthesia Plan Comments:        Anesthesia Quick Evaluation

## 2021-01-29 NOTE — H&P (Signed)
TOTAL KNEE ADMISSION H&P  Patient is being admitted for right total knee arthroplasty.  Subjective:  Chief Complaint:right knee pain.  HPI: Sally Hopkins, 71 y.o. female, has a history of pain and functional disability in the right knee due to arthritis and has failed non-surgical conservative treatments for greater than 12 weeks to includeNSAID's and/or analgesics, corticosteriod injections, and activity modification.  Onset of symptoms was gradual, starting 2 years ago with gradually worsening course since that time. The patient noted no past surgery on the right knee(s).  Patient currently rates pain in the right knee(s) at 8 out of 10 with activity. Patient has worsening of pain with activity and weight bearing and pain that interferes with activities of daily living.  Patient has evidence of joint space narrowing by imaging studies.  There is no active infection.  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   Current Outpatient Medications  Medication Sig Dispense Refill Last Dose   cetirizine (ZYRTEC) 10 MG tablet Take 10 mg by mouth daily.       chlorthalidone (HYGROTON) 25 MG tablet TAKE ONE TABLET BY MOUTH ONCE DAILY 90 tablet 0    Cholecalciferol (VITAMIN D3) 50 MCG (2000 UT) CAPS Take 2,000 Units by mouth daily.      Coenzyme Q10 (CO Q-10 PO) Take 1 capsule by mouth daily.      glucosamine-chondroitin 500-400 MG tablet Take 1 tablet by mouth 3 (three) times daily.      losartan (COZAAR) 100 MG tablet TAKE ONE TABLET BY MOUTH DAILY 90 tablet 0    metoprolol succinate (TOPROL-XL) 50 MG 24 hr tablet TAKE ONE TABLET BY MOUTH ONCE DAILY AFTER MEALS 90 tablet 0    Multiple Vitamin (MULTIVITAMIN) capsule Take 1 capsule by mouth daily.      potassium chloride SA (KLOR-CON M) 20 MEQ tablet TAKE ONE TABLET BY MOUTH TWICE DAILY 180 tablet 0    rosuvastatin (CRESTOR) 20 MG tablet Take 1 tablet (20 mg total) by mouth daily. 90 tablet 1    valACYclovir (VALTREX) 500 MG tablet TAKE ONE TABLET TWICE A DAY. 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 (Patient taking differently: Take 500 mg by mouth daily.) 180 tablet 1    vitamin C (ASCORBIC ACID) 500 MG tablet Take 500 mg by mouth in the morning and at bedtime.      zinc gluconate 50 MG tablet Take 50 mg by mouth daily.      ALPRAZolam (XANAX) 0.25 MG tablet Take 1 tablet (0.25 mg total) by mouth as needed for  anxiety. (Patient not taking: Reported on 01/16/2021) 30 tablet 5    COLLAGEN PO Take by mouth.      NON FORMULARY Slim Microbiome activating Green coffee bean, garcinia cambogia, mulberry extract, alpha lipoic acid      Allergies  Allergen Reactions   Erythromycin    Macrobid  [Nitrofurantoin Macrocrystal]    Sulfa Antibiotics    Penicillins Rash   Thimerosol [Thimerosal] Rash    Social History   Tobacco Use   Smoking status: Never   Smokeless tobacco: Never  Substance Use Topics   Alcohol use: Yes    Comment: social    Family History  Problem Relation Age of Onset   ALS Father    Heart disease Mother        Congenital , died post op.       Review of Systems   Constitutional:  Negative for chills and fever.  Respiratory:  Negative for cough and shortness of breath.   Cardiovascular:  Negative for chest pain.  Gastrointestinal:  Negative for nausea and vomiting.  Musculoskeletal:  Positive for arthralgias.    Objective:  Physical Exam Well nourished and well developed. General: Alert and oriented x3, cooperative and pleasant, no acute distress. Head: normocephalic, atraumatic, neck supple. Eyes: EOMI.  Musculoskeletal: Bilateral knee exams: Noted bilateral genu varum No palpable effusions, warmth erythema Tenderness mainly medially and anteriorly Stable and intact medial and lateral collateral ligaments No lower extremity edema or erythema  Calves soft and nontender. Motor function intact in LE. Strength 5/5 LE bilaterally. Neuro: Distal pulses 2+. Sensation to light touch intact in LE.   Vital signs in last 24 hours:    Labs:   Estimated body mass index is 29.26 kg/m as calculated from the following:   Height as of 01/17/21: 5\' 2"  (1.575 m).   Weight as of 01/17/21: 72.6 kg.   Imaging Review Plain radiographs demonstrate severe degenerative joint disease of the right knee(s). The overall alignment isneutral. The bone quality appears to be adequate for age and reported activity level.      Assessment/Plan:  End stage arthritis, right knee   The patient history, physical examination, clinical judgment of the provider and imaging studies are consistent with end stage degenerative joint disease of the right knee(s) and total knee arthroplasty is deemed medically necessary. The treatment options including medical management, injection therapy arthroscopy and arthroplasty were discussed at length. The risks and benefits of total knee arthroplasty were presented and reviewed. The risks due to aseptic loosening, infection, stiffness, patella tracking problems, thromboembolic complications and other imponderables were discussed.  The patient acknowledged the explanation, agreed to proceed with the plan and consent was signed. Patient is being admitted for inpatient treatment for surgery, pain control, PT, OT, prophylactic antibiotics, VTE prophylaxis, progressive ambulation and ADL's and discharge planning. The patient is planning to be discharged  home.   Therapy Plans: outpatient therapy at [facility] Disposition: Home with husband Planned DVT Prophylaxis: aspirin 81mg  BID DME needed: walker PCP: Tedra [place], clearance received Cardiologist: Dr. Percival Spanish, apt on 1/11 TXA: IV Allergies: PCN - hives , erythromycin - hives , thimerosal (preservative)- hives Anesthesia Concerns: none BMI: 29.4 Last HgbA1c: Not diabetic   Other: - Left knee CI at time of surgery (bothering her a lot at H&P) - Oxycodone, robaxin, tylenol  Patient's anticipated LOS is less than 2 midnights, meeting these requirements: - Younger than 34 - Lives within 1 hour of care - Has a competent adult at home  to recover with post-op recover - NO history of  - Chronic pain requiring opiods  - Diabetes  - Coronary Artery Disease  - Heart failure  - Heart attack  - Stroke  - DVT/VTE  - Cardiac arrhythmia  - Respiratory Failure/COPD  - Renal failure  - Anemia  - Advanced Liver disease  Costella Hatcher, PA-C Orthopedic Surgery [facility] [place] [phone]

## 2021-01-30 ENCOUNTER — Ambulatory Visit (HOSPITAL_COMMUNITY)
Admission: RE | Admit: 2021-01-30 | Discharge: 2021-01-30 | Disposition: A | Discharge status: Home / Self Care | Payer: PPO | Attending: Orthopedic Surgery | Admitting: Orthopedic Surgery

## 2021-01-30 ENCOUNTER — Encounter (HOSPITAL_COMMUNITY): Payer: Self-pay | Admitting: Orthopedic Surgery

## 2021-01-30 ENCOUNTER — Ambulatory Visit (HOSPITAL_COMMUNITY): Payer: PPO | Admitting: Physician Assistant

## 2021-01-30 ENCOUNTER — Ambulatory Visit (HOSPITAL_COMMUNITY): Payer: PPO | Admitting: Anesthesiology

## 2021-01-30 ENCOUNTER — Other Ambulatory Visit: Payer: Self-pay

## 2021-01-30 ENCOUNTER — Encounter (HOSPITAL_COMMUNITY)
Admission: RE | Disposition: A | Discharge status: Home / Self Care | Payer: Self-pay | Source: Home / Self Care | Attending: Orthopedic Surgery

## 2021-01-30 DIAGNOSIS — G8918 Other acute postprocedural pain: Secondary | ICD-10-CM | POA: Diagnosis not present

## 2021-01-30 DIAGNOSIS — M25761 Osteophyte, right knee: Secondary | ICD-10-CM | POA: Diagnosis not present

## 2021-01-30 DIAGNOSIS — M6588 Other synovitis and tenosynovitis, other site: Secondary | ICD-10-CM | POA: Diagnosis not present

## 2021-01-30 DIAGNOSIS — I10 Essential (primary) hypertension: Secondary | ICD-10-CM | POA: Insufficient documentation

## 2021-01-30 DIAGNOSIS — R2681 Unsteadiness on feet: Secondary | ICD-10-CM | POA: Diagnosis not present

## 2021-01-30 DIAGNOSIS — M25461 Effusion, right knee: Secondary | ICD-10-CM | POA: Diagnosis not present

## 2021-01-30 DIAGNOSIS — M1711 Unilateral primary osteoarthritis, right knee: Secondary | ICD-10-CM

## 2021-01-30 DIAGNOSIS — M1712 Unilateral primary osteoarthritis, left knee: Secondary | ICD-10-CM | POA: Diagnosis not present

## 2021-01-30 DIAGNOSIS — M170 Bilateral primary osteoarthritis of knee: Secondary | ICD-10-CM | POA: Diagnosis not present

## 2021-01-30 DIAGNOSIS — M659 Synovitis and tenosynovitis, unspecified: Secondary | ICD-10-CM | POA: Diagnosis not present

## 2021-01-30 DIAGNOSIS — Z96651 Presence of right artificial knee joint: Secondary | ICD-10-CM

## 2021-01-30 HISTORY — PX: TOTAL KNEE ARTHROPLASTY: SHX125

## 2021-01-30 LAB — ABO/RH: ABO/RH(D): A POS

## 2021-01-30 SURGERY — ARTHROPLASTY, KNEE, TOTAL
Anesthesia: Regional | Site: Knee | Laterality: Right

## 2021-01-30 MED ORDER — ACETAMINOPHEN 325 MG PO TABS: 325.0000 mg | ORAL_TABLET | ORAL | Status: DC | PRN

## 2021-01-30 MED ORDER — BISACODYL 10 MG RE SUPP: 10.0000 mg | Freq: Every day | RECTAL | Status: DC | PRN

## 2021-01-30 MED ORDER — ASPIRIN 81 MG PO CHEW: 81.0000 mg | CHEWABLE_TABLET | Freq: Two times a day (BID) | ORAL | Status: DC

## 2021-01-30 MED ORDER — DOCUSATE SODIUM 100 MG PO CAPS: 100.0000 mg | ORAL_CAPSULE | Freq: Two times a day (BID) | ORAL | Status: DC

## 2021-01-30 MED ORDER — 0.9 % SODIUM CHLORIDE (POUR BTL) OPTIME
TOPICAL | Status: DC | PRN
  Administered 2021-01-30: 08:00:00 1000 mL

## 2021-01-30 MED ORDER — POVIDONE-IODINE 10 % EX SWAB
2.0000 "application " | Freq: Once | CUTANEOUS | Status: AC
  Administered 2021-01-30: 2 via TOPICAL

## 2021-01-30 MED ORDER — ONDANSETRON HCL 4 MG/2ML IJ SOLN: 4.0000 mg | Freq: Four times a day (QID) | INTRAMUSCULAR | Status: DC | PRN

## 2021-01-30 MED ORDER — BUPIVACAINE-EPINEPHRINE (PF) 0.25% -1:200000 IJ SOLN
INTRAMUSCULAR | Status: AC
  Filled 2021-01-30: qty 30

## 2021-01-30 MED ORDER — OXYCODONE HCL 5 MG PO TABS
5.0000 mg | ORAL_TABLET | Freq: Once | ORAL | Status: DC | PRN
Start: 2021-01-30 — End: 2021-01-30

## 2021-01-30 MED ORDER — FENTANYL CITRATE PF 50 MCG/ML IJ SOSY: 25.0000 ug | PREFILLED_SYRINGE | INTRAMUSCULAR | Status: DC | PRN

## 2021-01-30 MED ORDER — LIDOCAINE HCL 1 % IJ SOLN
INTRAMUSCULAR | Status: DC | PRN
  Administered 2021-01-30: 6 mL

## 2021-01-30 MED ORDER — ACETAMINOPHEN 325 MG PO TABS: 325.0000 mg | ORAL_TABLET | Freq: Four times a day (QID) | ORAL | Status: DC | PRN

## 2021-01-30 MED ORDER — PROPOFOL 500 MG/50ML IV EMUL
INTRAVENOUS | Status: DC | PRN
Start: 2021-01-30 — End: 2021-01-30
  Administered 2021-01-30: 40 ug/kg/min via INTRAVENOUS

## 2021-01-30 MED ORDER — MIDAZOLAM HCL 2 MG/2ML IJ SOLN
INTRAMUSCULAR | Status: AC
  Filled 2021-01-30: qty 2

## 2021-01-30 MED ORDER — MEPERIDINE HCL 50 MG/ML IJ SOLN: 6.2500 mg | INTRAMUSCULAR | Status: DC | PRN

## 2021-01-30 MED ORDER — MIDAZOLAM HCL 5 MG/5ML IJ SOLN
INTRAMUSCULAR | Status: DC | PRN
  Administered 2021-01-30: 2 mg via INTRAVENOUS

## 2021-01-30 MED ORDER — ORAL CARE MOUTH RINSE: 15.0000 mL | Freq: Once | OROMUCOSAL | Status: AC

## 2021-01-30 MED ORDER — FENTANYL CITRATE (PF) 100 MCG/2ML IJ SOLN
INTRAMUSCULAR | Status: DC | PRN
Start: 2021-01-30 — End: 2021-01-30
  Administered 2021-01-30: 50 ug via INTRAVENOUS

## 2021-01-30 MED ORDER — MENTHOL 3 MG MT LOZG: 1.0000 | LOZENGE | OROMUCOSAL | Status: DC | PRN

## 2021-01-30 MED ORDER — MEPIVACAINE HCL (PF) 2 % IJ SOLN
INTRAMUSCULAR | Status: DC | PRN
  Administered 2021-01-30: 3.5 mL via INTRATHECAL

## 2021-01-30 MED ORDER — ASPIRIN 81 MG PO CHEW
81.0000 mg | CHEWABLE_TABLET | Freq: Two times a day (BID) | ORAL | 0 refills | Status: AC
Start: 2021-01-30 — End: 2021-02-27

## 2021-01-30 MED ORDER — LACTATED RINGERS IV SOLN: INTRAVENOUS | Status: DC

## 2021-01-30 MED ORDER — CEFAZOLIN SODIUM-DEXTROSE 2-4 GM/100ML-% IV SOLN
2.0000 g | INTRAVENOUS | Status: AC
  Administered 2021-01-30: 07:00:00 2 g via INTRAVENOUS
  Filled 2021-01-30: qty 100

## 2021-01-30 MED ORDER — METHYLPREDNISOLONE ACETATE 80 MG/ML IJ SUSP
INTRAMUSCULAR | Status: DC | PRN
  Administered 2021-01-30: 80 mg via INTRA_ARTICULAR

## 2021-01-30 MED ORDER — PHENYLEPHRINE HCL-NACL 20-0.9 MG/250ML-% IV SOLN
INTRAVENOUS | Status: DC | PRN
Start: 2021-01-30 — End: 2021-01-30
  Administered 2021-01-30: 25 ug/min via INTRAVENOUS

## 2021-01-30 MED ORDER — FERROUS SULFATE 325 (65 FE) MG PO TABS: 325.0000 mg | ORAL_TABLET | Freq: Three times a day (TID) | ORAL | Status: DC

## 2021-01-30 MED ORDER — TRANEXAMIC ACID-NACL 1000-0.7 MG/100ML-% IV SOLN: 1000.0000 mg | Freq: Once | INTRAVENOUS | Status: DC

## 2021-01-30 MED ORDER — ONDANSETRON HCL 4 MG/2ML IJ SOLN
INTRAMUSCULAR | Status: AC
  Filled 2021-01-30: qty 2

## 2021-01-30 MED ORDER — MEPIVACAINE HCL (PF) 2 % IJ SOLN
INTRAMUSCULAR | Status: AC
  Filled 2021-01-30: qty 20

## 2021-01-30 MED ORDER — METOCLOPRAMIDE HCL 5 MG PO TABS: 5.0000 mg | ORAL_TABLET | Freq: Three times a day (TID) | ORAL | Status: DC | PRN

## 2021-01-30 MED ORDER — METHOCARBAMOL 500 MG PO TABS: 500.0000 mg | ORAL_TABLET | Freq: Four times a day (QID) | ORAL | Status: DC | PRN

## 2021-01-30 MED ORDER — ONDANSETRON HCL 4 MG/2ML IJ SOLN
INTRAMUSCULAR | Status: DC | PRN
  Administered 2021-01-30: 4 mg via INTRAVENOUS

## 2021-01-30 MED ORDER — DEXAMETHASONE SODIUM PHOSPHATE 10 MG/ML IJ SOLN: 10.0000 mg | Freq: Once | INTRAMUSCULAR | Status: DC

## 2021-01-30 MED ORDER — POLYETHYLENE GLYCOL 3350 17 G PO PACK: 17.0000 g | PACK | Freq: Two times a day (BID) | ORAL | 0 refills | Status: DC

## 2021-01-30 MED ORDER — POTASSIUM CHLORIDE CRYS ER 20 MEQ PO TBCR: 20.0000 meq | EXTENDED_RELEASE_TABLET | Freq: Two times a day (BID) | ORAL | Status: DC

## 2021-01-30 MED ORDER — METOCLOPRAMIDE HCL 5 MG/ML IJ SOLN: 5.0000 mg | Freq: Three times a day (TID) | INTRAMUSCULAR | Status: DC | PRN

## 2021-01-30 MED ORDER — ONDANSETRON HCL 4 MG PO TABS: 4.0000 mg | ORAL_TABLET | Freq: Four times a day (QID) | ORAL | Status: DC | PRN

## 2021-01-30 MED ORDER — PHENOL 1.4 % MT LIQD: 1.0000 | OROMUCOSAL | Status: DC | PRN

## 2021-01-30 MED ORDER — ROSUVASTATIN CALCIUM 20 MG PO TABS: 20.0000 mg | ORAL_TABLET | Freq: Every day | ORAL | Status: DC

## 2021-01-30 MED ORDER — DIPHENHYDRAMINE HCL 12.5 MG/5ML PO ELIX: 12.5000 mg | ORAL_SOLUTION | ORAL | Status: DC | PRN

## 2021-01-30 MED ORDER — FENTANYL CITRATE (PF) 100 MCG/2ML IJ SOLN
INTRAMUSCULAR | Status: AC
  Filled 2021-01-30: qty 2

## 2021-01-30 MED ORDER — OXYCODONE HCL 5 MG PO TABS
ORAL_TABLET | ORAL | Status: AC
  Filled 2021-01-30: qty 2

## 2021-01-30 MED ORDER — METHYLPREDNISOLONE ACETATE 40 MG/ML IJ SUSP
INTRAMUSCULAR | Status: AC
  Filled 2021-01-30: qty 2

## 2021-01-30 MED ORDER — LACTATED RINGERS IV BOLUS
500.0000 mL | Freq: Once | INTRAVENOUS | Status: AC
  Administered 2021-01-30: 09:00:00 500 mL via INTRAVENOUS

## 2021-01-30 MED ORDER — POLYETHYLENE GLYCOL 3350 17 G PO PACK: 17.0000 g | PACK | Freq: Every day | ORAL | Status: DC | PRN

## 2021-01-30 MED ORDER — DEXAMETHASONE SODIUM PHOSPHATE 10 MG/ML IJ SOLN
8.0000 mg | Freq: Once | INTRAMUSCULAR | Status: AC
  Administered 2021-01-30: 08:00:00 8 mg via INTRAVENOUS

## 2021-01-30 MED ORDER — CEFAZOLIN SODIUM-DEXTROSE 2-4 GM/100ML-% IV SOLN: 2.0000 g | Freq: Four times a day (QID) | INTRAVENOUS | Status: DC

## 2021-01-30 MED ORDER — PHENYLEPHRINE HCL (PRESSORS) 10 MG/ML IV SOLN
INTRAVENOUS | Status: AC
  Filled 2021-01-30: qty 1

## 2021-01-30 MED ORDER — LACTATED RINGERS IV BOLUS: 250.0000 mL | Freq: Once | INTRAVENOUS | Status: DC

## 2021-01-30 MED ORDER — KETOROLAC TROMETHAMINE 30 MG/ML IJ SOLN
INTRAMUSCULAR | Status: DC | PRN
  Administered 2021-01-30: 30 mg via INTRA_ARTICULAR

## 2021-01-30 MED ORDER — PROPOFOL 1000 MG/100ML IV EMUL
INTRAVENOUS | Status: AC
  Filled 2021-01-30: qty 100

## 2021-01-30 MED ORDER — ACETAMINOPHEN 160 MG/5ML PO SOLN: 325.0000 mg | ORAL | Status: DC | PRN

## 2021-01-30 MED ORDER — OXYCODONE HCL 5 MG PO TABS
5.0000 mg | ORAL_TABLET | ORAL | 0 refills | Status: DC | PRN
Start: 2021-01-30 — End: 2021-03-08

## 2021-01-30 MED ORDER — METHOCARBAMOL 500 MG PO TABS: 500.0000 mg | ORAL_TABLET | Freq: Four times a day (QID) | ORAL | 0 refills | Status: DC | PRN

## 2021-01-30 MED ORDER — SODIUM CHLORIDE (PF) 0.9 % IJ SOLN
INTRAMUSCULAR | Status: DC | PRN
  Administered 2021-01-30: 30 mL

## 2021-01-30 MED ORDER — STERILE WATER FOR IRRIGATION IR SOLN
Status: DC | PRN
  Administered 2021-01-30: 2000 mL

## 2021-01-30 MED ORDER — ROPIVACAINE HCL 7.5 MG/ML IJ SOLN
INTRAMUSCULAR | Status: DC | PRN
  Administered 2021-01-30: 25 mL via PERINEURAL

## 2021-01-30 MED ORDER — ONDANSETRON HCL 4 MG/2ML IJ SOLN: 4.0000 mg | Freq: Once | INTRAMUSCULAR | Status: DC | PRN

## 2021-01-30 MED ORDER — LACTATED RINGERS IV BOLUS
250.0000 mL | Freq: Once | INTRAVENOUS | Status: AC
  Administered 2021-01-30: 11:00:00 250 mL via INTRAVENOUS

## 2021-01-30 MED ORDER — LOSARTAN POTASSIUM 50 MG PO TABS: 100.0000 mg | ORAL_TABLET | Freq: Every day | ORAL | Status: DC

## 2021-01-30 MED ORDER — CHLORTHALIDONE 25 MG PO TABS
25.0000 mg | ORAL_TABLET | Freq: Every day | ORAL | Status: DC
Start: 2021-01-31 — End: 2021-01-30

## 2021-01-30 MED ORDER — DEXAMETHASONE SODIUM PHOSPHATE 10 MG/ML IJ SOLN
INTRAMUSCULAR | Status: AC
  Filled 2021-01-30: qty 1

## 2021-01-30 MED ORDER — PROPOFOL 10 MG/ML IV BOLUS
INTRAVENOUS | Status: AC
  Filled 2021-01-30: qty 20

## 2021-01-30 MED ORDER — OXYCODONE HCL 5 MG PO TABS: 10.0000 mg | ORAL_TABLET | ORAL | Status: DC | PRN

## 2021-01-30 MED ORDER — METHOCARBAMOL 500 MG IVPB - SIMPLE MED: 500.0000 mg | Freq: Four times a day (QID) | INTRAVENOUS | Status: DC | PRN

## 2021-01-30 MED ORDER — LIDOCAINE HCL (PF) 1 % IJ SOLN
INTRAMUSCULAR | Status: AC
  Filled 2021-01-30: qty 30

## 2021-01-30 MED ORDER — METOPROLOL SUCCINATE ER 50 MG PO TB24: 50.0000 mg | ORAL_TABLET | Freq: Every day | ORAL | Status: DC

## 2021-01-30 MED ORDER — HYDROMORPHONE HCL 1 MG/ML IJ SOLN: 0.5000 mg | INTRAMUSCULAR | Status: DC | PRN

## 2021-01-30 MED ORDER — KETOROLAC TROMETHAMINE 30 MG/ML IJ SOLN
INTRAMUSCULAR | Status: AC
  Filled 2021-01-30: qty 1

## 2021-01-30 MED ORDER — SODIUM CHLORIDE 0.9 % IV SOLN: INTRAVENOUS | Status: DC

## 2021-01-30 MED ORDER — TRANEXAMIC ACID-NACL 1000-0.7 MG/100ML-% IV SOLN
1000.0000 mg | INTRAVENOUS | Status: AC
  Administered 2021-01-30: 08:00:00 1000 mg via INTRAVENOUS
  Filled 2021-01-30: qty 100

## 2021-01-30 MED ORDER — CHLORHEXIDINE GLUCONATE 0.12 % MT SOLN
15.0000 mL | Freq: Once | OROMUCOSAL | Status: AC
  Administered 2021-01-30: 06:00:00 15 mL via OROMUCOSAL

## 2021-01-30 MED ORDER — LORATADINE 10 MG PO TABS
10.0000 mg | ORAL_TABLET | Freq: Every day | ORAL | Status: DC
Start: 2021-01-30 — End: 2021-01-30

## 2021-01-30 MED ORDER — OXYCODONE HCL 5 MG PO TABS
5.0000 mg | ORAL_TABLET | ORAL | Status: DC | PRN
  Administered 2021-01-30: 13:00:00 10 mg via ORAL

## 2021-01-30 MED ORDER — BUPIVACAINE-EPINEPHRINE (PF) 0.25% -1:200000 IJ SOLN
INTRAMUSCULAR | Status: DC | PRN
  Administered 2021-01-30: 30 mL

## 2021-01-30 MED ORDER — OXYCODONE HCL 5 MG/5ML PO SOLN
5.0000 mg | Freq: Once | ORAL | Status: DC | PRN
Start: 2021-01-30 — End: 2021-01-30

## 2021-01-30 SURGICAL SUPPLY — 58 items
ADH SKN CLS APL DERMABOND .7 (GAUZE/BANDAGES/DRESSINGS) ×1
ATTUNE MED ANAT PAT 35 KNEE (Knees) ×1 IMPLANT
ATTUNE MED ANAT PAT 35MM KNEE (Knees) ×1 IMPLANT
ATTUNE PSFEM RTSZ4 NARCEM KNEE (Femur) ×2 IMPLANT
ATTUNE PSRP INSR SZ4 7 KNEE (Insert) ×1 IMPLANT
ATTUNE PSRP INSR SZ4 7MM KNEE (Insert) ×1 IMPLANT
BAG COUNTER SPONGE SURGICOUNT (BAG) IMPLANT
BAG SPEC THK2 15X12 ZIP CLS (MISCELLANEOUS)
BAG SPNG CNTER NS LX DISP (BAG)
BAG SURGICOUNT SPONGE COUNTING (BAG)
BAG ZIPLOCK 12X15 (MISCELLANEOUS) IMPLANT
BASE TIBIAL ROT PLAT SZ 3 KNEE (Knees) IMPLANT
BLADE SAW SGTL 11.0X1.19X90.0M (BLADE) IMPLANT
BLADE SAW SGTL 13.0X1.19X90.0M (BLADE) ×4 IMPLANT
BLADE SURG SZ10 CARB STEEL (BLADE) ×8 IMPLANT
BNDG ELASTIC 6X5.8 VLCR STR LF (GAUZE/BANDAGES/DRESSINGS) ×4 IMPLANT
BOWL SMART MIX CTS (DISPOSABLE) ×4 IMPLANT
BSPLAT TIB 3 CMNT ROT PLAT STR (Knees) ×1 IMPLANT
CEMENT HV SMART SET (Cement) ×4 IMPLANT
CUFF TOURN SGL QUICK 34 (TOURNIQUET CUFF) ×3
CUFF TRNQT CYL 34X4.125X (TOURNIQUET CUFF) ×2 IMPLANT
DECANTER SPIKE VIAL GLASS SM (MISCELLANEOUS) ×8 IMPLANT
DERMABOND ADVANCED (GAUZE/BANDAGES/DRESSINGS) ×2
DERMABOND ADVANCED .7 DNX12 (GAUZE/BANDAGES/DRESSINGS) ×2 IMPLANT
DRAPE INCISE IOBAN 66X45 STRL (DRAPES) ×4 IMPLANT
DRAPE U-SHAPE 47X51 STRL (DRAPES) ×4 IMPLANT
DRESSING AQUACEL AG SP 3.5X10 (GAUZE/BANDAGES/DRESSINGS) ×2 IMPLANT
DRSG AQUACEL AG ADV 3.5X10 (GAUZE/BANDAGES/DRESSINGS) ×2 IMPLANT
DRSG AQUACEL AG SP 3.5X10 (GAUZE/BANDAGES/DRESSINGS) ×3
DURAPREP 26ML APPLICATOR (WOUND CARE) ×8 IMPLANT
ELECT REM PT RETURN 15FT ADLT (MISCELLANEOUS) ×4 IMPLANT
GLOVE SURG ENC MOIS LTX SZ6 (GLOVE) ×4 IMPLANT
GLOVE SURG ENC MOIS LTX SZ7 (GLOVE) ×4 IMPLANT
GLOVE SURG UNDER POLY LF SZ7.5 (GLOVE) ×4 IMPLANT
GOWN STRL REUS W/TWL LRG LVL3 (GOWN DISPOSABLE) ×4 IMPLANT
HANDPIECE INTERPULSE COAX TIP (DISPOSABLE) ×3
HOLDER FOLEY CATH W/STRAP (MISCELLANEOUS) IMPLANT
KIT TURNOVER KIT A (KITS) IMPLANT
MANIFOLD NEPTUNE II (INSTRUMENTS) ×4 IMPLANT
NDL SAFETY ECLIPSE 18X1.5 (NEEDLE) IMPLANT
NEEDLE HYPO 18GX1.5 SHARP (NEEDLE)
NS IRRIG 1000ML POUR BTL (IV SOLUTION) ×4 IMPLANT
PACK TOTAL KNEE CUSTOM (KITS) ×4 IMPLANT
PROTECTOR NERVE ULNAR (MISCELLANEOUS) ×4 IMPLANT
SET HNDPC FAN SPRY TIP SCT (DISPOSABLE) ×2 IMPLANT
SET PAD KNEE POSITIONER (MISCELLANEOUS) ×4 IMPLANT
SPONGE T-LAP 18X18 ~~LOC~~+RFID (SPONGE) ×12 IMPLANT
SUT MNCRL AB 4-0 PS2 18 (SUTURE) ×4 IMPLANT
SUT STRATAFIX PDS+ 0 24IN (SUTURE) ×4 IMPLANT
SUT VIC AB 1 CT1 36 (SUTURE) ×4 IMPLANT
SUT VIC AB 2-0 CT1 27 (SUTURE) ×9
SUT VIC AB 2-0 CT1 TAPERPNT 27 (SUTURE) ×6 IMPLANT
SYR 3ML LL SCALE MARK (SYRINGE) ×4 IMPLANT
TIBIAL BASE ROT PLAT SZ 3 KNEE (Knees) ×3 IMPLANT
TRAY FOLEY MTR SLVR 14FR STAT (SET/KITS/TRAYS/PACK) ×2 IMPLANT
TUBE SUCTION HIGH CAP CLEAR NV (SUCTIONS) ×4 IMPLANT
WATER STERILE IRR 1000ML POUR (IV SOLUTION) ×8 IMPLANT
WRAP KNEE MAXI GEL POST OP (GAUZE/BANDAGES/DRESSINGS) ×4 IMPLANT

## 2021-01-30 NOTE — Discharge Instructions (Signed)

## 2021-01-30 NOTE — Evaluation (Signed)
Physical Therapy Evaluation Patient Details Name: Sally Hopkins MRN: [id number] DOB: 1950/09/01 Today's Date: 01/30/2021  History of Present Illness  R TKA on 01/30/21  Clinical Impression    Patient has met PT goals for DC.      Recommendations for follow up therapy are one component of a multi-disciplinary discharge planning process, led by the attending physician.  Recommendations may be updated based on patient status, additional functional criteria and insurance authorization.  Follow Up Recommendations Follow physician's recommendations for discharge plan and follow up therapies    Assistance Recommended at Discharge Intermittent Supervision/Assistance  Patient can return home with the following  A little help with walking and/or transfers;Help with stairs or ramp for entrance;Assist for transportation    Equipment Recommendations Rolling walker (2 wheels)  Recommendations for Other Services       Functional Status Assessment Patient has had a recent decline in their functional status and demonstrates the ability to make significant improvements in function in a reasonable and predictable amount of time.     Precautions / Restrictions Precautions Precautions: Knee;Fall Restrictions Weight Bearing Restrictions: No      Mobility  Bed Mobility Overal bed mobility: Needs Assistance Bed Mobility: Supine to Sit     Supine to sit: Min guard     General bed mobility comments: extra time    Transfers Overall transfer level: Needs assistance Equipment used: Rolling walker (2 wheels) Transfers: Sit to/from Stand Sit to Stand: Min assist           General transfer comment: cues for hand and right leg position    Ambulation/Gait Ambulation/Gait assistance: Min assist Gait Distance (Feet): 30 Feet (then 50') Assistive device: Rolling walker (2 wheels) Gait Pattern/deviations: Step-to pattern, Step-through pattern       General Gait Details: cues for  sequence  Stairs Stairs: Yes Stairs assistance: Min assist Stair Management: Backwards, With walker Number of Stairs: 2 General stair comments: spouse present for instruction  Wheelchair Mobility    Modified Rankin (Stroke Patients Only)       Balance Overall balance assessment: Mild deficits observed, not formally tested                                           Pertinent Vitals/Pain Pain Assessment Pain Assessment: Faces Faces Pain Scale: Hurts little more Pain Location: right knee Pain Descriptors / Indicators: Discomfort Pain Intervention(s): Patient requesting pain meds-RN notified    Home Living Family/patient expects to be discharged to:: Private residence Living Arrangements: Spouse/significant other Available Help at Discharge: Family Type of Home: House Home Access: Stairs to enter Entrance Stairs-Rails: None Entrance Stairs-Number of Steps: 2 Alternate Level Stairs-Number of Steps: 15 Home Layout: Two level Home Equipment: Conservation officer, nature (2 wheels)      Prior Function Prior Level of Function : Independent/Modified Independent                     Hand Dominance   Dominant Hand: Right    Extremity/Trunk Assessment   Upper Extremity Assessment Upper Extremity Assessment: Overall WFL for tasks assessed    Lower Extremity Assessment Lower Extremity Assessment: RLE deficits/detail RLE Deficits / Details: 10-60 knee flexion. + SLR    Cervical / Trunk Assessment Cervical / Trunk Assessment: Normal  Communication   Communication: No difficulties  Cognition Arousal/Alertness: Awake/alert Behavior During Therapy: WFL for tasks assessed/performed Overall  Cognitive Status: Within Functional Limits for tasks assessed                                          General Comments      Exercises Total Joint Exercises Ankle Circles/Pumps: AROM, 10 reps Quad Sets: AROM, 10 reps Hip ABduction/ADduction: 10  reps, AROM Straight Leg Raises: AAROM, 10 reps Long Arc Quad: AROM, 10 reps Knee Flexion: AROM, 10 reps   Assessment/Plan    PT Assessment All further PT needs can be met in the next venue of care  PT Problem List Decreased strength;Decreased balance;Decreased range of motion;Decreased activity tolerance       PT Treatment Interventions      PT Goals (Current goals can be found in the Care Plan section)  Acute Rehab PT Goals PT Goal Formulation: All assessment and education complete, DC therapy    Frequency       Co-evaluation               AM-PAC PT "6 Clicks" Mobility  Outcome Measure Help needed turning from your back to your side while in a flat bed without using bedrails?: A Little Help needed moving from lying on your back to sitting on the side of a flat bed without using bedrails?: A Little Help needed moving to and from a bed to a chair (including a wheelchair)?: A Little Help needed standing up from a chair using your arms (e.g., wheelchair or bedside chair)?: A Little Help needed to walk in hospital room?: A Little Help needed climbing 3-5 steps with a railing? : A Little 6 Click Score: 18    End of Session Equipment Utilized During Treatment: Gait belt Activity Tolerance: Patient tolerated treatment well Patient left: in chair;with call bell/phone within reach;with family/visitor present Nurse Communication: Mobility status PT Visit Diagnosis: Unsteadiness on feet (R26.81);Pain Pain - Right/Left: Right Pain - part of body: Knee    Time: 1152-1230 PT Time Calculation (min) (ACUTE ONLY): 38 min   Charges:   PT Evaluation $PT Eval Low Complexity: 1 Low PT Treatments $Gait Training: 8-22 mins $Therapeutic Exercise: 8-22 mins        Tresa Endo PT Acute Rehabilitation Services Pager [phone] Office [phone]   Claretha Cooper 01/30/2021, 1:45 PM

## 2021-01-30 NOTE — Op Note (Signed)
NAME:  [place] RECORD NO.:  [id number]                             FACILITY:  [facility]      PHYSICIAN:  Pietro Cassis. Alvan Dame, M.D.  DATE OF BIRTH:  [birth year]      DATE OF PROCEDURE:  01/30/2021                                     OPERATIVE REPORT         PREOPERATIVE DIAGNOSIS: 1.  Right knee osteoarthritis. 2. Left knee osteoarthritis     POSTOPERATIVE DIAGNOSIS: 1.  Right knee osteoarthritis. 2. Left knee osteoarthritis     FINDINGS:  The patient was noted to have complete loss of cartilage and   bone-on-bone arthritis with associated osteophytes in the medial and patellofemroal compartments of   the knee with a significant synovitis and associated effusion.  The patient had failed months of conservative treatment including medications, injection therapy, activity modification.     PROCEDURE: 1.  Right total knee replacement. 2. Left knee intra-articular injection (80 mg of Depomedrol/6 cc 1% Lidocaine)     COMPONENTS USED:  DePuy Attune rotating platform posterior stabilized knee   system, a size 4N femur, 3 tibia, size 7 mm PS AOX insert, and 35 anatomic patellar   button.      SURGEON:  Pietro Cassis. Alvan Dame, M.D.      ASSISTANT:  Costella Hatcher, PA-C.      ANESTHESIA:  Regional and Spinal.      SPECIMENS:  None.      COMPLICATION:  None.      DRAINS:  None.  EBL: <100      TOURNIQUET TIME:   Total Tourniquet Time Documented: Thigh (Right) - 25 minutes Total: Thigh (Right) - 25 minutes  .      The patient was stable to the recovery room.      INDICATION FOR PROCEDURE:  Sally Hopkins is a 71 y.o. female patient of   mine.  The patient had been seen, evaluated, and treated for months conservatively in the   office with medication, activity modification, and injections.  The patient had   radiographic changes of bone-on-bone arthritis with endplate sclerosis and osteophytes noted.  Based on the radiographic changes and failed  conservative measures, the patient   decided to proceed with definitive treatment, total knee replacement.  Risks of infection, DVT, component failure, need for revision surgery, neurovascular injury were reviewed in the office setting.  The postop course was reviewed stressing the efforts to maximize post-operative satisfaction and function.  Consent was obtained for benefit of pain   relief.      PROCEDURE IN DETAIL:  The patient was brought to the operative theater.   Once adequate anesthesia, preoperative antibiotics, 2 gm of Ancef,1 gm of Tranexamic Acid, and 10 mg of Decadron administered, the patient was positioned supine with a right thigh tourniquet placed.  The  right lower extremity was prepped and draped in sterile fashion.  A time-   out was performed identifying the patient, planned procedure, and the appropriate extremity.      The right lower extremity was placed in the [facility] leg holder.  The leg was   exsanguinated, tourniquet elevated to 250 mmHg.  A midline incision was   made followed by median parapatellar arthrotomy.  Following initial   exposure, attention was first directed to the patella.  Precut   measurement was noted to be 22 mm.  I resected down to 14 mm and used a   35 anatomic patellar button to restore patellar height as well as cover the cut surface.      The lug holes were drilled and a metal shim was placed to protect the   patella from retractors and saw blade during the procedure.      At this point, attention was now directed to the femur.  The femoral   canal was opened with a drill, irrigated to try to prevent fat emboli.  An   intramedullary rod was passed at 3 degrees valgus, 11 mm of bone was   resected off the distal femur due to pre-operative flexion contracture.  Following this resection, the tibia was   subluxated anteriorly.  Using the extramedullary guide, 2 mm of bone was resected off   the proximal medial tibia.  We confirmed the gap would  be   stable medially and laterally with a size 5 spacer block as well as confirmed that the tibial cut was perpendicular in the coronal plane, checking with an alignment rod.      Once this was done, I sized the femur to be a size 4 in the anterior-   posterior dimension, chose a narrow component based on medial and   lateral dimension.  The size 4 rotation block was then pinned in   position anterior referenced using the C-clamp to set rotation.  The   anterior, posterior, and  chamfer cuts were made without difficulty nor   notching making certain that I was along the anterior cortex to help   with flexion gap stability.      The final box cut was made off the lateral aspect of distal femur.      At this point, the tibia was sized to be a size 3.  The size 3 tray was   then pinned in position through the medial third of the tubercle,   drilled, and keel punched.  Trial reduction was now carried with a 4 femur,  3 tibia, a size 7 mm PS insert, and the 35 anatomic patella botton.  The knee was brought to full extension with good flexion stability with the patella   tracking through the trochlea without application of pressure.  Given   all these findings the trial components removed.  Final components were   opened and cement was mixed.  The knee was irrigated with normal saline solution and pulse lavage.  The synovial lining was   then injected with 30 cc of 0.25% Marcaine with epinephrine, 1 cc of Toradol and 30 cc of NS for a total of 61 cc.     Final implants were then cemented onto cleaned and dried cut surfaces of bone with the knee brought to extension with a size 7 mm PS trial insert.      Once the cement had fully cured, excess cement was removed   throughout the knee.  I confirmed that I was satisfied with the range of   motion and stability, and the final size 7 mm PS AOX insert was chosen.  It was   placed into the knee.      The tourniquet had been  let down at 25 minutes.   No significant   hemostasis was required.  The extensor mechanism was then reapproximated using #1 Vicryl and #1 Stratafix sutures with the knee   in flexion.  The   remaining wound was closed with 2-0 Vicryl and running 4-0 Monocryl.   The knee was cleaned, dried, dressed sterilely using Dermabond and   Aquacel dressing.    Once the right knee was dressed the left knee was injected with Depo and Lidocaine under betadine clean prep.  Band aid applied.  The patient was then   brought to recovery room in stable condition, tolerating the procedure   well.   Please note that Physician Assistant, Costella Hatcher, PA-C was present for the entirety of the case, and was utilized for pre-operative positioning, peri-operative retractor management, general facilitation of the procedure and for primary wound closure at the end of the case.              Pietro Cassis Alvan Dame, M.D.    01/30/2021 8:33 AM

## 2021-01-30 NOTE — Anesthesia Procedure Notes (Signed)
Spinal  Start time: 01/30/2021 7:19 AM End time: 01/30/2021 7:26 AM Reason for block: surgical anesthesia Staffing Resident/CRNA: Victoriano Lain, CRNA Preanesthetic Checklist Completed: patient identified, IV checked, site marked, risks and benefits discussed, surgical consent, monitors and equipment checked, pre-op evaluation and timeout performed Spinal Block Patient position: sitting Prep: DuraPrep and site prepped and draped Patient monitoring: heart rate, continuous pulse ox and blood pressure Approach: midline Location: L3-4 Injection technique: single-shot Needle Needle type: Pencan  Needle gauge: 24 G Needle length: 10 cm Assessment Sensory level: T4 Events: CSF return Additional Notes Spinal kit expiration date checked and verified. Pt placed in sitting position with monitors. Sedation for pt comfort. Sterile prep and drape of back. One attempt by CRNA. + clear CSF, - heme. Pt tolerated well.

## 2021-01-30 NOTE — Anesthesia Postprocedure Evaluation (Signed)
Anesthesia Post Note  Patient: Sally Hopkins  Procedure(s) Performed: TOTAL KNEE ARTHROPLASTY (Right: Knee)     Patient location during evaluation: PACU Anesthesia Type: Regional and MAC Level of consciousness: awake and alert Pain management: pain level controlled Vital Signs Assessment: post-procedure vital signs reviewed and stable Respiratory status: spontaneous breathing, nonlabored ventilation, respiratory function stable and patient connected to nasal cannula oxygen Cardiovascular status: blood pressure returned to baseline and stable Postop Assessment: no apparent nausea or vomiting Anesthetic complications: no   No notable events documented.  Last Vitals:  Vitals:   01/30/21 1200 01/30/21 1247  BP:  (!) 144/70  Pulse: 76 71  Resp: 16 16  Temp:    SpO2: 100% 99%    Last Pain:  Vitals:   01/30/21 1247  TempSrc:   PainSc: 6                  [name]

## 2021-01-30 NOTE — Interval H&P Note (Signed)
History and Physical Interval Note:  01/30/2021 7:00 AM  Sally Hopkins  has presented today for surgery, with the diagnosis of Right knee osteoarthritis.  The various methods of treatment have been discussed with the patient and family. After consideration of risks, benefits and other options for treatment, the patient has consented to  Procedure(s): TOTAL KNEE ARTHROPLASTY (Right) as a surgical intervention.  The patient's history has been reviewed, patient examined, no change in status, stable for surgery.  I have reviewed the patient's chart and labs.  Questions were answered to the patient's satisfaction.     Mauri Pole

## 2021-01-30 NOTE — Care Plan (Signed)
Ortho Bundle Case Management Note  Patient Details  Name: Sally Hopkins MRN: [id number] Date of Birth: 10-06-1950                  R TKA on 01-30-21 DCP: Home with husband and son DME: RW ordered through [facility] PT: EO on 02-02-21.   DME Arranged:  Gilford Rile rolling DME Agency:  [employer]  HH Arranged:    [place] Agency:     Additional Comments: Please contact me with any questions of if this plan should need to change.  Marianne Sofia, RN,CCM [facility]  [phone] 01/30/2021, 9:30 AM

## 2021-01-30 NOTE — Transfer of Care (Signed)
Immediate Anesthesia Transfer of Care Note  Patient: Sally Hopkins  Procedure(s) Performed: TOTAL KNEE ARTHROPLASTY (Right: Knee)  Patient Location: PACU  Anesthesia Type:MAC and Spinal  Level of Consciousness: awake, alert , oriented and patient cooperative  Airway & Oxygen Therapy: Patient Spontanous Breathing and Patient connected to face mask oxygen  Post-op Assessment: Report given to RN and Post -op Vital signs reviewed and stable  Post vital signs: Reviewed and stable  Last Vitals:  Vitals Value Taken Time  BP 100/53 01/30/21 0858  Temp    Pulse 66 01/30/21 0900  Resp 21 01/30/21 0900  SpO2 99 % 01/30/21 0900  Vitals shown include unvalidated device data.  Last Pain:  Vitals:   01/30/21 0625  TempSrc: Oral  PainSc:       Patients Stated Pain Goal: 4 (78/97/84 7841)  Complications: No notable events documented.

## 2021-01-30 NOTE — Anesthesia Procedure Notes (Signed)
Anesthesia Regional Block: Adductor canal block   Pre-Anesthetic Checklist: , timeout performed,  Correct Patient, Correct Site, Correct Laterality,  Correct Procedure, Correct Position, site marked,  Risks and benefits discussed,  Surgical consent,  Pre-op evaluation,  At surgeon's request and post-op pain management  Laterality: Right  Prep: chloraprep       Needles:  Injection technique: Single-shot  Needle Type: Echogenic Stimulator Needle     Needle Length: 5cm  Needle Gauge: 22     Additional Needles:   Procedures:, nerve stimulator,,, ultrasound used (permanent image in chart),,    Narrative:  Start time: 01/30/2021 7:00 AM End time: 01/30/2021 7:05 AM Injection made incrementally with aspirations every 5 mL.  Performed by: Personally  Anesthesiologist: Janeece Riggers, MD  Additional Notes: Functioning IV was confirmed and monitors were applied.  A 29mm 22ga Arrow echogenic stimulator needle was used. Sterile prep and drape,hand hygiene and sterile gloves were used. Ultrasound guidance: relevant anatomy identified, needle position confirmed, local anesthetic spread visualized around nerve(s)., vascular puncture avoided.  Image printed for medical record. Negative aspiration and negative test dose prior to incremental administration of local anesthetic. The patient tolerated the procedure well.

## 2021-01-31 ENCOUNTER — Encounter (HOSPITAL_COMMUNITY): Payer: Self-pay | Admitting: Orthopedic Surgery

## 2021-02-02 DIAGNOSIS — M25561 Pain in right knee: Secondary | ICD-10-CM | POA: Diagnosis not present

## 2021-02-02 NOTE — Progress Notes (Signed)
Pulse oximetry is an absolute necessity on a patient who is actively under anesthesia in surgery. I am not sure why this would ever be questioned.

## 2021-02-05 DIAGNOSIS — M25561 Pain in right knee: Secondary | ICD-10-CM | POA: Diagnosis not present

## 2021-02-07 DIAGNOSIS — M25561 Pain in right knee: Secondary | ICD-10-CM | POA: Diagnosis not present

## 2021-02-09 DIAGNOSIS — M25561 Pain in right knee: Secondary | ICD-10-CM | POA: Diagnosis not present

## 2021-02-12 DIAGNOSIS — M25561 Pain in right knee: Secondary | ICD-10-CM | POA: Diagnosis not present

## 2021-02-14 DIAGNOSIS — M25561 Pain in right knee: Secondary | ICD-10-CM | POA: Diagnosis not present

## 2021-02-16 DIAGNOSIS — M25561 Pain in right knee: Secondary | ICD-10-CM | POA: Diagnosis not present

## 2021-02-19 ENCOUNTER — Other Ambulatory Visit: Payer: PPO | Admitting: Internal Medicine

## 2021-02-19 DIAGNOSIS — M25561 Pain in right knee: Secondary | ICD-10-CM | POA: Diagnosis not present

## 2021-02-22 ENCOUNTER — Ambulatory Visit: Payer: PPO | Admitting: Internal Medicine

## 2021-02-22 DIAGNOSIS — M25561 Pain in right knee: Secondary | ICD-10-CM | POA: Diagnosis not present

## 2021-02-27 DIAGNOSIS — M25561 Pain in right knee: Secondary | ICD-10-CM | POA: Diagnosis not present

## 2021-03-01 DIAGNOSIS — M25561 Pain in right knee: Secondary | ICD-10-CM | POA: Diagnosis not present

## 2021-03-05 ENCOUNTER — Other Ambulatory Visit: Payer: Self-pay

## 2021-03-05 ENCOUNTER — Other Ambulatory Visit: Payer: PPO | Admitting: Internal Medicine

## 2021-03-05 DIAGNOSIS — E782 Mixed hyperlipidemia: Secondary | ICD-10-CM

## 2021-03-05 DIAGNOSIS — M25561 Pain in right knee: Secondary | ICD-10-CM | POA: Diagnosis not present

## 2021-03-06 LAB — LIPID PANEL
Cholesterol: 178 mg/dL (ref <200)
HDL: 58 mg/dL (ref >=50)
LDL Cholesterol (Calc): 97 mg/dL (calc)
Non-HDL Cholesterol (Calc): 120 mg/dL (calc) (ref <130)
Total CHOL/HDL Ratio: 3.1 (calc) (ref <5.0)
Triglycerides: 136 mg/dL (ref <150)

## 2021-03-06 LAB — HEPATIC FUNCTION PANEL
AG Ratio: 1.6 (calc) (ref 1.0–2.5)
ALT: 16 U/L (ref 6–29)
AST: 17 U/L (ref 10–35)
Albumin: 4.3 g/dL (ref 3.6–5.1)
Alkaline phosphatase (APISO): 45 U/L (ref 37–153)
Bilirubin, Direct: 0.1 mg/dL (ref 0.0–0.2)
Globulin: 2.7 g/dL (calc) (ref 1.9–3.7)
Indirect Bilirubin: 0.4 mg/dL (calc) (ref 0.2–1.2)
Total Bilirubin: 0.5 mg/dL (ref 0.2–1.2)
Total Protein: 7 g/dL (ref 6.1–8.1)

## 2021-03-08 ENCOUNTER — Other Ambulatory Visit: Payer: Self-pay

## 2021-03-08 ENCOUNTER — Ambulatory Visit (INDEPENDENT_AMBULATORY_CARE_PROVIDER_SITE_OTHER): Payer: PPO | Admitting: Internal Medicine

## 2021-03-08 ENCOUNTER — Encounter: Payer: Self-pay | Admitting: Internal Medicine

## 2021-03-08 VITALS — BP 128/70 | HR 78 | Temp 98.5°F | Ht 62.0 in | Wt 161.5 lb

## 2021-03-08 DIAGNOSIS — Z96651 Presence of right artificial knee joint: Secondary | ICD-10-CM

## 2021-03-08 DIAGNOSIS — E7800 Pure hypercholesterolemia, unspecified: Secondary | ICD-10-CM | POA: Diagnosis not present

## 2021-03-08 DIAGNOSIS — R7302 Impaired glucose tolerance (oral): Secondary | ICD-10-CM

## 2021-03-08 DIAGNOSIS — M25561 Pain in right knee: Secondary | ICD-10-CM | POA: Diagnosis not present

## 2021-03-08 DIAGNOSIS — E782 Mixed hyperlipidemia: Secondary | ICD-10-CM | POA: Diagnosis not present

## 2021-03-08 DIAGNOSIS — I10 Essential (primary) hypertension: Secondary | ICD-10-CM | POA: Diagnosis not present

## 2021-03-08 NOTE — Progress Notes (Signed)
? ?  Subjective:  ? ? Patient ID: Sally Hopkins, female    DOB: 04-01-1950, 71 y.o.   MRN: [id number] ? ?HPI Currently on generic Crestor 20 mg daily for hyperlipidemia and here for follow up.  Had right knee arthroplasty by Dr. Alvan Dame January 24.  Has been in physical therapy..  PT has proved challenging but she is dealing with it fairly well. ? ?She saw Dr. Percival Spanish in January 2023.  At that time she denied shortness of breath, PND or orthopnea.  No reported palpitations or syncope.  EKG was within normal limits with the exception of a right bundle branch block.  No change from previous EKGs.  Her blood pressure was excellent at 120/70.  Had moderate LVH on echocardiogram in June.  Dr. Percival Spanish felt this was related to hypertension.  He felt her blood pressure was stable on current regimen.  Echocardiogram in June 2022 was unchanged from previous echocardiogram.  Had normal left ventricular ejection fraction and some moderate left ventricular hypertrophy. ? ?History of hyperlipidemia, allergic rhinitis, allergic urticaria, hypertension. ? ?Has declined colonoscopy.  Cologuard has been discussed but she has been reluctant to do that as well. ? ?Lipid panel and liver functions are completely normal.  He is taking meloxicam 15 mg daily.  Blood pressure is stable on metoprolol and losartan.  Currently on rosuvastatin 20 mg daily. ? ?Review of Systems no new complaints ? ?History of impaired glucose tolerance hemoglobin A1c 5.8% and previously was 5.3% a year ago.  Less activity due to knee issues. ? ?   ?Objective:  ? Physical Exam ?Blood pressure 128/70 pulse 78 temperature 98.5 degrees pulse oximetry 98% weight 161 pounds 8 ounces BMI 29.54 height 5 feet 2 inches ? ?Skin: Warm and dry.  Chest clear to auscultation.  Cardiac exam: Regular rate and rhythm normal S1 and S2 without ectopy or murmurs. ? ? ? ?   ?Assessment & Plan:  ?Hyperlipidemia treated with Crestor and stable.  Liver functions are normal.  Total  cholesterol is 178, HDL 58, triglycerides 136 and LDL cholesterol 97.  Continue with Crestor at 20 mg daily.  CPE due November 2023. ? ?History of right TKA by Dr. Alvan Dame and recovering well.  Doing well with physical therapy.  Says left knee needs to be done at some point as well. ? ?Hemoglobin A1c stable at 5.8% with diet alone. ? ?Hypertension-stable on current regimen ? ?Plan: Schedule physical examination November 2023.  Continue current medications.  Try to take meloxicam sparingly.  Continue losartan and metoprolol as well as hypertonic for hypertension.  Also on potassium supplementation.  Continue rosuvastatin 20 mg daily.  Lipid panel is excellent. ? ?

## 2021-03-08 NOTE — Patient Instructions (Addendum)
Continue rosuvastatin 20 mg daily.  Labs are stable.  Hemoglobin A1c is stable.  Follow-up for CPE November 2023.  Hemoglobin A1c is stable with diet alone.  Blood pressure stable on current regimen. ?

## 2021-03-13 DIAGNOSIS — M25561 Pain in right knee: Secondary | ICD-10-CM | POA: Diagnosis not present

## 2021-03-16 DIAGNOSIS — M25561 Pain in right knee: Secondary | ICD-10-CM | POA: Diagnosis not present

## 2021-03-19 ENCOUNTER — Other Ambulatory Visit: Payer: Self-pay | Admitting: Internal Medicine

## 2021-03-19 DIAGNOSIS — M25561 Pain in right knee: Secondary | ICD-10-CM | POA: Diagnosis not present

## 2021-03-21 ENCOUNTER — Other Ambulatory Visit: Payer: Self-pay | Admitting: Internal Medicine

## 2021-03-21 DIAGNOSIS — Z471 Aftercare following joint replacement surgery: Secondary | ICD-10-CM | POA: Diagnosis not present

## 2021-03-21 DIAGNOSIS — Z96651 Presence of right artificial knee joint: Secondary | ICD-10-CM | POA: Diagnosis not present

## 2021-03-22 DIAGNOSIS — M25561 Pain in right knee: Secondary | ICD-10-CM | POA: Diagnosis not present

## 2021-04-02 DIAGNOSIS — M25561 Pain in right knee: Secondary | ICD-10-CM | POA: Diagnosis not present

## 2021-04-05 DIAGNOSIS — M25561 Pain in right knee: Secondary | ICD-10-CM | POA: Diagnosis not present

## 2021-04-09 DIAGNOSIS — M25561 Pain in right knee: Secondary | ICD-10-CM | POA: Diagnosis not present

## 2021-04-11 DIAGNOSIS — M25561 Pain in right knee: Secondary | ICD-10-CM | POA: Diagnosis not present

## 2021-04-17 DIAGNOSIS — M25561 Pain in right knee: Secondary | ICD-10-CM | POA: Diagnosis not present

## 2021-04-20 DIAGNOSIS — M25561 Pain in right knee: Secondary | ICD-10-CM | POA: Diagnosis not present

## 2021-04-24 DIAGNOSIS — M25561 Pain in right knee: Secondary | ICD-10-CM | POA: Diagnosis not present

## 2021-04-25 DIAGNOSIS — M13862 Other specified arthritis, left knee: Secondary | ICD-10-CM | POA: Diagnosis not present

## 2021-04-25 DIAGNOSIS — Z96651 Presence of right artificial knee joint: Secondary | ICD-10-CM | POA: Diagnosis not present

## 2021-04-25 DIAGNOSIS — Z471 Aftercare following joint replacement surgery: Secondary | ICD-10-CM | POA: Diagnosis not present

## 2021-06-19 ENCOUNTER — Other Ambulatory Visit: Payer: Self-pay | Admitting: Internal Medicine

## 2021-07-04 DIAGNOSIS — D2371 Other benign neoplasm of skin of right lower limb, including hip: Secondary | ICD-10-CM | POA: Diagnosis not present

## 2021-07-04 DIAGNOSIS — D2261 Melanocytic nevi of right upper limb, including shoulder: Secondary | ICD-10-CM | POA: Diagnosis not present

## 2021-07-04 DIAGNOSIS — Z85828 Personal history of other malignant neoplasm of skin: Secondary | ICD-10-CM | POA: Diagnosis not present

## 2021-07-04 DIAGNOSIS — D225 Melanocytic nevi of trunk: Secondary | ICD-10-CM | POA: Diagnosis not present

## 2021-07-04 DIAGNOSIS — Z85820 Personal history of malignant melanoma of skin: Secondary | ICD-10-CM | POA: Diagnosis not present

## 2021-07-04 DIAGNOSIS — L821 Other seborrheic keratosis: Secondary | ICD-10-CM | POA: Diagnosis not present

## 2021-07-04 DIAGNOSIS — L570 Actinic keratosis: Secondary | ICD-10-CM | POA: Diagnosis not present

## 2021-07-04 DIAGNOSIS — D2262 Melanocytic nevi of left upper limb, including shoulder: Secondary | ICD-10-CM | POA: Diagnosis not present

## 2021-09-15 ENCOUNTER — Other Ambulatory Visit: Payer: Self-pay | Admitting: Internal Medicine

## 2021-11-08 ENCOUNTER — Other Ambulatory Visit: Payer: PPO

## 2021-11-08 DIAGNOSIS — E782 Mixed hyperlipidemia: Secondary | ICD-10-CM

## 2021-11-08 DIAGNOSIS — R5383 Other fatigue: Secondary | ICD-10-CM

## 2021-11-08 DIAGNOSIS — R7302 Impaired glucose tolerance (oral): Secondary | ICD-10-CM | POA: Diagnosis not present

## 2021-11-08 DIAGNOSIS — I10 Essential (primary) hypertension: Secondary | ICD-10-CM | POA: Diagnosis not present

## 2021-11-09 LAB — COMPLETE METABOLIC PANEL WITH GFR
AG Ratio: 1.6 (calc) (ref 1.0–2.5)
ALT: 16 U/L (ref 6–29)
AST: 18 U/L (ref 10–35)
Albumin: 4.1 g/dL (ref 3.6–5.1)
Alkaline phosphatase (APISO): 45 U/L (ref 37–153)
BUN/Creatinine Ratio: 32 (calc) — ABNORMAL HIGH (ref 6–22)
BUN: 31 mg/dL — ABNORMAL HIGH (ref 7–25)
CO2: 25 mmol/L (ref 20–32)
Calcium: 9.7 mg/dL (ref 8.6–10.4)
Chloride: 105 mmol/L (ref 98–110)
Creat: 0.98 mg/dL (ref 0.60–1.00)
Globulin: 2.6 g/dL (calc) (ref 1.9–3.7)
Glucose, Bld: 104 mg/dL — ABNORMAL HIGH (ref 65–99)
Potassium: 4.2 mmol/L (ref 3.5–5.3)
Sodium: 139 mmol/L (ref 135–146)
Total Bilirubin: 0.6 mg/dL (ref 0.2–1.2)
Total Protein: 6.7 g/dL (ref 6.1–8.1)
eGFR: 62 mL/min/{1.73_m2} (ref >=60)

## 2021-11-09 LAB — CBC WITH DIFFERENTIAL/PLATELET
Absolute Monocytes: 495 cells/uL (ref 200–950)
Basophils Absolute: 50 cells/uL (ref 0–200)
Basophils Relative: 0.9 %
Eosinophils Absolute: 281 cells/uL (ref 15–500)
Eosinophils Relative: 5.1 %
HCT: 36.6 % (ref 35.0–45.0)
Hemoglobin: 12.8 g/dL (ref 11.7–15.5)
Lymphs Abs: 1463 cells/uL (ref 850–3900)
MCH: 32.2 pg (ref 27.0–33.0)
MCHC: 35 g/dL (ref 32.0–36.0)
MCV: 92.2 fL (ref 80.0–100.0)
MPV: 10.4 fL (ref 7.5–12.5)
Monocytes Relative: 9 %
Neutro Abs: 3212 cells/uL (ref 1500–7800)
Neutrophils Relative %: 58.4 %
Platelets: 207 10*3/uL (ref 140–400)
RBC: 3.97 10*6/uL (ref 3.80–5.10)
RDW: 13 % (ref 11.0–15.0)
Total Lymphocyte: 26.6 %
WBC: 5.5 10*3/uL (ref 3.8–10.8)

## 2021-11-09 LAB — HEMOGLOBIN A1C
Hgb A1c MFr Bld: 5.7 % of total Hgb — ABNORMAL HIGH (ref <5.7)
Mean Plasma Glucose: 117 mg/dL
eAG (mmol/L): 6.5 mmol/L

## 2021-11-09 LAB — LIPID PANEL
Cholesterol: 168 mg/dL (ref <200)
HDL: 56 mg/dL (ref >=50)
LDL Cholesterol (Calc): 86 mg/dL (calc)
Non-HDL Cholesterol (Calc): 112 mg/dL (calc) (ref <130)
Total CHOL/HDL Ratio: 3 (calc) (ref <5.0)
Triglycerides: 159 mg/dL — ABNORMAL HIGH (ref <150)

## 2021-11-09 LAB — TSH: TSH: 4.09 mIU/L (ref 0.40–4.50)

## 2021-11-13 ENCOUNTER — Ambulatory Visit (INDEPENDENT_AMBULATORY_CARE_PROVIDER_SITE_OTHER): Payer: PPO | Admitting: Internal Medicine

## 2021-11-13 ENCOUNTER — Encounter: Payer: Self-pay | Admitting: Internal Medicine

## 2021-11-13 ENCOUNTER — Other Ambulatory Visit: Payer: Self-pay

## 2021-11-13 VITALS — BP 124/70 | HR 64 | Temp 99.1°F | Ht 61.75 in | Wt 161.4 lb

## 2021-11-13 DIAGNOSIS — Z85820 Personal history of malignant melanoma of skin: Secondary | ICD-10-CM | POA: Diagnosis not present

## 2021-11-13 DIAGNOSIS — E782 Mixed hyperlipidemia: Secondary | ICD-10-CM

## 2021-11-13 DIAGNOSIS — I10 Essential (primary) hypertension: Secondary | ICD-10-CM

## 2021-11-13 DIAGNOSIS — Z96651 Presence of right artificial knee joint: Secondary | ICD-10-CM

## 2021-11-13 DIAGNOSIS — M1712 Unilateral primary osteoarthritis, left knee: Secondary | ICD-10-CM | POA: Diagnosis not present

## 2021-11-13 DIAGNOSIS — Z800 Family history of malignant neoplasm of digestive organs: Secondary | ICD-10-CM

## 2021-11-13 DIAGNOSIS — E7800 Pure hypercholesterolemia, unspecified: Secondary | ICD-10-CM | POA: Diagnosis not present

## 2021-11-13 DIAGNOSIS — Z9889 Other specified postprocedural states: Secondary | ICD-10-CM

## 2021-11-13 DIAGNOSIS — R7302 Impaired glucose tolerance (oral): Secondary | ICD-10-CM | POA: Diagnosis not present

## 2021-11-13 DIAGNOSIS — H8109 Meniere's disease, unspecified ear: Secondary | ICD-10-CM | POA: Diagnosis not present

## 2021-11-13 DIAGNOSIS — Z0000 Encounter for general adult medical examination without abnormal findings: Secondary | ICD-10-CM

## 2021-11-13 LAB — POCT URINALYSIS DIPSTICK
Bilirubin, UA: NEGATIVE
Blood, UA: NEGATIVE
Glucose, UA: NEGATIVE
Ketones, UA: NEGATIVE
Leukocytes, UA: NEGATIVE
Nitrite, UA: NEGATIVE
Protein, UA: NEGATIVE
Spec Grav, UA: 1.015 (ref 1.010–1.025)
Urobilinogen, UA: 0.2 E.U./dL
pH, UA: 5 (ref 5.0–8.0)

## 2021-11-13 NOTE — Progress Notes (Addendum)
Annual Wellness Visit     Patient: Sally Hopkins, Female    DOB: 1950-11-08, 71 y.o.   MRN: [id number] Visit Date: 11/13/2021 &71 year old Female, patient in this practice seen for Medicare wellness and health maintenance exam.Has  developed plantar fasciitits left foot. Also, she says she needs left knee arthroplasty. We have recently received clearance request for Surgery on January 30th 2024.  She underwent right knee arthroplasty in January 2023.  In February 2021, she had a reaction after receiving second COVID-vaccine with fever, chills, myalgias, malaise and fatigue that was protracted.  She saw Dr. Percival Spanish to rule out myocarditis.  He did not feel she had myocarditis.  She had a low-grade temp up to 100.5 degrees.  COVID test was negative as well as flu test.  Echocardiogram was normal and she subsequently recovered without sequela.  She has a history of Mnire's disease which is stable and in remission.  History of hypertension, hyperlipidemia, Herpes simplex type II.  She takes potassium supplement due to hypokalemia from diuretic therapy.  She has a history of allergic urticaria and allergic rhinitis seen by Dr. Neldon Mc.  She has a history of very mild glucose intolerance with hemoglobin A1c 5.7%.  This is treated with diet only.  TSH is upper limits of normal at 4.09 and will continue to be followed.  I do not think she is hypothyroid at the present time.  Triglycerides are mildly elevated at 159.  For hypertension she is treated with losartan and metoprolol as well as chlorthalidone.  She takes a potassium supplement.  She is on rosuvastatin 20 mg daily.  Dr. Percival Spanish performed EKG in January 2023 which showed normal sinus rhythm and right bundle branch block  Social history: Married with 2 adult children.  Does not smoke.  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  Graduated from [employer]  Social alcohol  consumption.  Family history: Father died at age 56 of ALS but also had colon cancer.  Mother with history of open heart surgery 1969 for congenital heart disease and also had CABG.  1 sister in good health.                Patient Care Team: Elby Showers, MD as PCP - General (Internal Medicine)  Review of Systems denies chest pain, shortness of breath, headache, depression, cough   Objective    Vitals:   Blood pressure 124/70, pulse 64, temperature 99.1 degrees pulse oximetry 98% weight 161 pounds 6.4 ounces BMI 29.76  Skin: Warm and dry.  TMs clear.  Pharynx is clear.  Neck is supple without JVD, thyromegaly, carotid bruits.  Chest is clear to auscultation without rales or wheezing.  Cardiac exam: Regular rate and rhythm without ectopy or murmur.  Abdomen is soft, nondistended without hepatosplenomegaly, masses or tenderness.  No lower extremity pitting edema.  Brief neurological exam is intact without gross focal deficits.  Affect, thought and judgment are normal.   Most recent functional status assessment:    01/17/2021    2:15 PM  In your present state of health, do you have any difficulty performing the following activities:  Doing errands, shopping? 0   Most recent fall risk assessment:    11/09/2020    2:15 PM  [place] in the past year? 1  Injury with Fall? 1  Risk for fall due to : History of fall(s)  Follow up  Falls evaluation completed;Education provided;Falls prevention discussed    Most recent depression screenings:    11/09/2020    2:16 PM 11/08/2019    2:15 PM  PHQ 2/9 Scores  PHQ - 2 Score 0 0   Most recent cognitive screening:    11/09/2020    2:17 PM  6CIT Screen  What Year? 0 points  What month? 0 points  What time? 0 points  Count back from 20 0 points  Months in reverse 0 points  Repeat phrase 0 points  Total Score 0 points       Assessment & Plan   End-stage osteoarthritis left knee and says she will be looking to  have left TKA sometime after the first of the year.  History of right knee TKA  History of Mnire's disease-no recent episodes  Allergic rhinitis seen by Dr. Neldon Mc  Allergic urticaria-seen by Dr. Neldon Mc  Status post right knee TKA  History of mixed hyperlipidemia treated with statin medication  History of right bundle branch block on EKG done by Dr. Percival Spanish January 2023  Remote history of melanoma  Mild impaired glucose tolerance treated with diet  Plan: Discussed pneumococcal 20 vaccine.  She declines further COVID vaccines after bad experience with 1 dose of COVID-vaccine.  Tetanus immunization is up-to-date.  Recommend flu vaccine.    Recheck appointment in 6 months i.e. May 2024.  Patient is medically cleared for left TKA to be done after the first of the year             Annual wellness visit done today including the all of the following: Reviewed patient's Family Medical History Reviewed and updated list of patient's medical providers Assessment of cognitive impairment was done Assessed patient's functional ability Established a written schedule for health screening [place] Completed and Reviewed  Discussed health benefits of physical activity, and encouraged her to engage in regular exercise appropriate for her age and condition.         {I, Elby Showers, MD, have reviewed all documentation for this visit. The documentation on 11/13/21 for the exam, diagnosis, procedures, and orders are all accurate and complete.   LaVon Barron Alvine, CMA

## 2021-11-16 ENCOUNTER — Other Ambulatory Visit: Payer: Self-pay

## 2021-11-16 DIAGNOSIS — Z780 Asymptomatic menopausal state: Secondary | ICD-10-CM

## 2021-12-05 ENCOUNTER — Telehealth: Payer: Self-pay | Admitting: Internal Medicine

## 2021-12-05 ENCOUNTER — Encounter: Payer: Self-pay | Admitting: Internal Medicine

## 2021-12-05 DIAGNOSIS — Z0289 Encounter for other administrative examinations: Secondary | ICD-10-CM

## 2021-12-05 NOTE — Telephone Encounter (Signed)
Received form for surgery Clearance from [facility], Dr Paralee Cancel for patient to have Left Total Knee Arthroplasty on 02/05/2022, to return completed form to [place] [facility] [phone], phone with questions [phone]  Patient last seen 11/13/2021 for annual medicare wellness, labs 11/08/2021, EKG 01/18/2021

## 2021-12-05 NOTE — Patient Instructions (Addendum)
Medical clearance for left knee TKA to be done in the near future.  Continue current medications and follow-up here in May 2024 for 106-monthrecheck.

## 2021-12-05 NOTE — Addendum Note (Signed)
Addended by: Elby Showers on: 12/05/2021 12:40 PM   Modules accepted: Level of Service

## 2021-12-06 ENCOUNTER — Telehealth: Payer: Self-pay

## 2021-12-06 NOTE — Telephone Encounter (Signed)
Faxed signed surgery clearance form, demographics, office notes 11/13/2021, labs 11/08/2021, medication list, immunization list to Orson Slick at [phone], phone [phone]

## 2021-12-06 NOTE — Telephone Encounter (Signed)
   Pre-operative Risk Assessment    Patient Name: Sally Hopkins  DOB: November 20, 1950 MRN: [id number]      Request for Surgical Clearance    Procedure:   Left total knee arthroplasty   Date of Surgery:  Clearance 02/05/22                                 Surgeon:  Dr. Paralee Cancel  Surgeon's Group or Practice Name:  [facility]  Phone number:  [phone] Fax number:  [phone]   Type of Clearance Requested:   - Medical    Type of Anesthesia:  Spinal   Additional requests/questions:    SignedJacqulynn Cadet   12/06/2021, 3:58 PM

## 2021-12-06 NOTE — Telephone Encounter (Signed)
This message was sent via [place], a product from [employer] http://www.biscom.com/                    -------Fax Transmission Report-------  To:               Recipient at [phone] Subject:          FW: Hp Scans Result:           The transmission was successful. Explanation:      All Pages Ok Pages Sent:       13 Connect Time:     5 minutes, 42 seconds Transmit Time:    12/06/2021 13:19 Transfer Rate:    14400 Status Code:      0000 Retry Count:      0 Job Id:           [id number] Unique Id:        [id number] Fax Line:         54 Fax Server:       [id number]

## 2021-12-07 ENCOUNTER — Telehealth: Payer: Self-pay | Admitting: *Deleted

## 2021-12-07 NOTE — Telephone Encounter (Signed)
Pt has been scheduled for tele pre op appt @ 01/24/22 @ 2 pm. Med rec and consent are done.     Patient Consent for Virtual Visit        Sally Hopkins has provided verbal consent on 12/07/2021 for a virtual visit (video or telephone).   CONSENT FOR VIRTUAL VISIT FOR:  Sally Hopkins  By participating in this virtual visit I agree to the following:  I hereby voluntarily request, consent and authorize [facility] and its employed or contracted physicians, physician assistants, nurse practitioners or other licensed health care professionals (the Practitioner), to provide me with telemedicine health care services (the "Services") as deemed necessary by the treating Practitioner. I acknowledge and consent to receive the Services by the Practitioner via telemedicine. I understand that the telemedicine visit will involve communicating with the Practitioner through live audiovisual communication technology and the disclosure of certain medical information by electronic transmission. I acknowledge that I have been given the opportunity to request an in-person assessment or other available alternative prior to the telemedicine visit and am voluntarily participating in the telemedicine visit.  I understand that I have the right to withhold or withdraw my consent to the use of telemedicine in the course of my care at any time, without affecting my right to future care or treatment, and that the Practitioner or I may terminate the telemedicine visit at any time. I understand that I have the right to inspect all information obtained and/or recorded in the course of the telemedicine visit and may receive copies of available information for a reasonable fee.  I understand that some of the potential risks of receiving the Services via telemedicine include:  Delay or interruption in medical evaluation due to technological equipment failure or disruption; Information transmitted may not be sufficient  (e.g. poor resolution of images) to allow for appropriate medical decision making by the Practitioner; and/or  In rare instances, security protocols could fail, causing a breach of personal health information.  Furthermore, I acknowledge that it is my responsibility to provide information about my medical history, conditions and care that is complete and accurate to the best of my ability. I acknowledge that Practitioner's advice, recommendations, and/or decision may be based on factors not within their control, such as incomplete or inaccurate data provided by me or distortions of diagnostic images or specimens that may result from electronic transmissions. I understand that the practice of medicine is not an exact science and that Practitioner makes no warranties or guarantees regarding treatment outcomes. I acknowledge that a copy of this consent can be made available to me via my patient portal ([place]), or I can request a printed copy by calling the office of [facility].    I understand that my insurance will be billed for this visit.   I have read or had this consent read to me. I understand the contents of this consent, which adequately explains the benefits and risks of the Services being provided via telemedicine.  I have been provided ample opportunity to ask questions regarding this consent and the Services and have had my questions answered to my satisfaction. I give my informed consent for the services to be provided through the use of telemedicine in my medical care

## 2021-12-07 NOTE — Telephone Encounter (Signed)
PRIMARY CARDIOLOGIST IS DR. [place].  Pt has been scheduled for tele pre op appt @ 01/24/22 @ 2 pm. Med rec and consent are done.

## 2021-12-07 NOTE — Telephone Encounter (Signed)
   Name: Sally Hopkins  DOB: 1950/11/10  MRN: [id number]  Primary Cardiologist: None   Preoperative team, please contact this patient and set up a phone call appointment for further preoperative risk assessment. Please obtain consent and complete medication review. Thank you for your help.  I confirm that guidance regarding antiplatelet and oral anticoagulation therapy has been completed and, if necessary, noted below.  Does not appear to be on antiplatelet/anticoag therapy.    Mayra Reel, NP 12/07/2021, 9:03 AM [name]

## 2021-12-15 ENCOUNTER — Other Ambulatory Visit: Payer: Self-pay | Admitting: Internal Medicine

## 2021-12-25 ENCOUNTER — Encounter: Payer: Self-pay | Admitting: Internal Medicine

## 2021-12-25 DIAGNOSIS — Z780 Asymptomatic menopausal state: Secondary | ICD-10-CM | POA: Diagnosis not present

## 2021-12-25 DIAGNOSIS — H1131 Conjunctival hemorrhage, right eye: Secondary | ICD-10-CM | POA: Diagnosis not present

## 2021-12-25 DIAGNOSIS — Z1231 Encounter for screening mammogram for malignant neoplasm of breast: Secondary | ICD-10-CM | POA: Diagnosis not present

## 2021-12-25 LAB — HM MAMMOGRAPHY

## 2021-12-25 LAB — HM DEXA SCAN: HM Dexa Scan: NORMAL

## 2021-12-27 ENCOUNTER — Encounter: Payer: Self-pay | Admitting: Internal Medicine

## 2021-12-27 ENCOUNTER — Telehealth: Payer: Self-pay | Admitting: Internal Medicine

## 2021-12-27 NOTE — Telephone Encounter (Signed)
Marrie called to say her husband tested positive for COVID on Tuesday, so far she has not. I did let her know we will be here in the morning but would then be out until Wednesday morning. I also walked her thru how to do a video visit thru [facility] just incase she come down with COVID thru the [facility]. She ask if you would give the Paxlovid for just in case and I told her you would not, that you need a positive test to prescribe.

## 2021-12-28 NOTE — Telephone Encounter (Signed)
Called to check on patient to see if she was still negative for COVID, LVM

## 2021-12-28 NOTE — Telephone Encounter (Signed)
Patient called back and said she still feels fine and is still negative for COVID

## 2022-01-10 DIAGNOSIS — L821 Other seborrheic keratosis: Secondary | ICD-10-CM | POA: Diagnosis not present

## 2022-01-10 DIAGNOSIS — C44619 Basal cell carcinoma of skin of left upper limb, including shoulder: Secondary | ICD-10-CM | POA: Diagnosis not present

## 2022-01-10 DIAGNOSIS — D2262 Melanocytic nevi of left upper limb, including shoulder: Secondary | ICD-10-CM | POA: Diagnosis not present

## 2022-01-10 DIAGNOSIS — D225 Melanocytic nevi of trunk: Secondary | ICD-10-CM | POA: Diagnosis not present

## 2022-01-10 DIAGNOSIS — D485 Neoplasm of uncertain behavior of skin: Secondary | ICD-10-CM | POA: Diagnosis not present

## 2022-01-10 DIAGNOSIS — L905 Scar conditions and fibrosis of skin: Secondary | ICD-10-CM | POA: Diagnosis not present

## 2022-01-10 DIAGNOSIS — Z85828 Personal history of other malignant neoplasm of skin: Secondary | ICD-10-CM | POA: Diagnosis not present

## 2022-01-10 DIAGNOSIS — Z85820 Personal history of malignant melanoma of skin: Secondary | ICD-10-CM | POA: Diagnosis not present

## 2022-01-10 DIAGNOSIS — L814 Other melanin hyperpigmentation: Secondary | ICD-10-CM | POA: Diagnosis not present

## 2022-01-10 DIAGNOSIS — L570 Actinic keratosis: Secondary | ICD-10-CM | POA: Diagnosis not present

## 2022-01-10 DIAGNOSIS — D2371 Other benign neoplasm of skin of right lower limb, including hip: Secondary | ICD-10-CM | POA: Diagnosis not present

## 2022-01-10 DIAGNOSIS — D2261 Melanocytic nevi of right upper limb, including shoulder: Secondary | ICD-10-CM | POA: Diagnosis not present

## 2022-01-21 NOTE — Progress Notes (Addendum)
Anesthesia Review:  PCP: DR Tommie Ard [place] LOV 11/13/21  Cardiologist : Chest x-ray : EKG : 01/23/22  Echo : 06/26/20  Stress test: Cardiac Cath :  Activity level: can do a flight of stairs without difficutly  Sleep Study/ CPAP : none  Fasting Blood Sugar :      / Checks Blood Sugar -- times a day:   Blood Thinner/ Instructions /Last Dose: ASA / Instructions/ Last Dose :    Right knee done 01/2021.    BMp done 01/23/22 routed to DR Paralee Cancel.

## 2022-01-21 NOTE — Patient Instructions (Signed)
SURGICAL WAITING ROOM VISITATION  Patients having surgery or a procedure may have no more than 2 support people in the waiting area - these visitors may rotate.    Children under the age of 63 must have an adult with them who is not the patient.  Due to an increase in RSV and influenza rates and associated hospitalizations, children ages 43 and under may not visit patients in [place].  If the patient needs to stay at the hospital during part of their recovery, the visitor guidelines for inpatient rooms apply. Pre-op nurse will coordinate an appropriate time for 1 support person to accompany patient in pre-op.  This support person may not rotate.    Please refer to the [facility] website for the visitor guidelines for Inpatients (after your surgery is over and you are in a regular room).       Your procedure is scheduled on:  02/05/22    Report to [facility] Main Entrance    Report to admitting at  [place] AM   Call this number if you have problems the morning of surgery [phone]   Do not eat food :After Midnight.   After Midnight you may have the following liquids until __ 0415____ AM  DAY OF SURGERY  Water Non-Citrus Juices (without pulp, NO RED-Apple, White grape, White cranberry) Black Coffee (NO MILK/CREAM OR CREAMERS, sugar ok)  Clear Tea (NO MILK/CREAM OR CREAMERS, sugar ok) regular and decaf                             Plain Jell-O (NO RED)                                           Fruit ices (not with fruit pulp, NO RED)                                     Popsicles (NO RED)                                                               Sports drinks like Gatorade (NO RED)                      The day of surgery:  Drink ONE (1) Pre-Surgery Clear Ensure or G2 at  0415 AM ( have completed by)  the morning of surgery. Drink in one sitting. Do not sip.  This drink was given to you during your hospital  pre-op appointment visit. Nothing else to  drink after completing the  Pre-Surgery Clear Ensure or G2.          If you have questions, please contact your surgeon's office.       Oral Hygiene is also important to reduce your risk of infection.                                    Remember - BRUSH YOUR TEETH THE MORNING OF  SURGERY WITH YOUR REGULAR TOOTHPASTE  DENTURES WILL BE REMOVED PRIOR TO SURGERY PLEASE DO NOT APPLY "Poly grip" OR ADHESIVES!!!   Do NOT smoke after Midnight   Take these medicines the morning of surgery with A SIP OF WATER:  zyrtec, toprol   DO NOT TAKE ANY ORAL DIABETIC MEDICATIONS DAY OF YOUR SURGERY  Bring CPAP mask and tubing day of surgery.                              You may not have any metal on your body including hair pins, jewelry, and body piercing             Do not wear make-up, lotions, powders, perfumes/cologne, or deodorant  Do not wear nail polish including gel and S&S, artificial/acrylic nails, or any other type of covering on natural nails including finger and toenails. If you have artificial nails, gel coating, etc. that needs to be removed by a nail salon please have this removed prior to surgery or surgery may need to be canceled/ delayed if the surgeon/ anesthesia feels like they are unable to be safely monitored.   Do not shave  48 hours prior to surgery.               Men may shave face and neck.   Do not bring valuables to the hospital. [place].   Contacts, glasses, dentures or bridgework may not be worn into surgery.   Bring small overnight bag day of surgery.   DO NOT [place]. PHARMACY WILL DISPENSE MEDICATIONS LISTED ON YOUR MEDICATION LIST TO YOU DURING YOUR ADMISSION [place]!    Patients discharged on the day of surgery will not be allowed to drive home.  Someone NEEDS to stay with you for the first 24 hours after anesthesia.   Special Instructions: Bring a copy of your  healthcare power of attorney and living will documents the day of surgery if you haven't scanned them before.              Please read over the following fact sheets you were given: [place] [phone]   If you received a COVID test during your pre-op visit  it is requested that you wear a mask when out in public, stay away from anyone that may not be feeling well and notify your surgeon if you develop symptoms. If you test positive for Covid or have been in contact with anyone that has tested positive in the last 10 days please notify you surgeon.    [place] - Preparing for Surgery Before surgery, you can play an important role.  Because skin is not sterile, your skin needs to be as free of germs as possible.  You can reduce the number of germs on your skin by washing with CHG (chlorahexidine gluconate) soap before surgery.  CHG is an antiseptic cleaner which kills germs and bonds with the skin to continue killing germs even after washing. Please DO NOT use if you have an allergy to CHG or antibacterial soaps.  If your skin becomes reddened/irritated stop using the CHG and inform your nurse when you arrive at Short Stay. Do not shave (including legs and underarms) for at least 48 hours prior to the  first CHG shower.  You may shave your face/neck. Please follow these instructions carefully:  1.  Shower with CHG Soap the night before surgery and the  morning of Surgery.  2.  If you choose to wash your hair, wash your hair first as usual with your  normal  shampoo.  3.  After you shampoo, rinse your hair and body thoroughly to remove the  shampoo.                           4.  Use CHG as you would any other liquid soap.  You can apply chg directly  to the skin and wash                       Gently with a scrungie or clean washcloth.  5.  Apply the CHG Soap to your body ONLY FROM THE NECK DOWN.   Do not use on face/ open                            Wound or open sores. Avoid contact with eyes, ears mouth and genitals (private parts).                       Wash face,  Genitals (private parts) with your normal soap.             6.  Wash thoroughly, paying special attention to the area where your surgery  will be performed.  7.  Thoroughly rinse your body with warm water from the neck down.  8.  DO NOT shower/wash with your normal soap after using and rinsing off  the CHG Soap.                9.  Pat yourself dry with a clean towel.            10.  Wear clean pajamas.            11.  Place clean sheets on your bed the night of your first shower and do not  sleep with pets. Day of Surgery : Do not apply any lotions/deodorants the morning of surgery.  Please wear clean clothes to the hospital/surgery center.  FAILURE TO FOLLOW THESE INSTRUCTIONS MAY RESULT IN THE CANCELLATION OF YOUR SURGERY PATIENT SIGNATURE_________________________________  NURSE SIGNATURE__________________________________  ________________________________________________________________________

## 2022-01-23 ENCOUNTER — Encounter (HOSPITAL_COMMUNITY): Payer: Self-pay

## 2022-01-23 ENCOUNTER — Other Ambulatory Visit: Payer: Self-pay

## 2022-01-23 ENCOUNTER — Encounter (HOSPITAL_COMMUNITY)
Admission: RE | Admit: 2022-01-23 | Discharge: 2022-01-23 | Disposition: A | Discharge status: Home / Self Care | Payer: PPO | Source: Ambulatory Visit | Attending: Orthopedic Surgery | Admitting: Orthopedic Surgery

## 2022-01-23 VITALS — BP 142/78 | HR 75 | Temp 98.3°F | Resp 16 | Ht 62.0 in | Wt 155.0 lb

## 2022-01-23 DIAGNOSIS — Z01818 Encounter for other preprocedural examination: Secondary | ICD-10-CM | POA: Diagnosis not present

## 2022-01-23 HISTORY — DX: Gastro-esophageal reflux disease without esophagitis: K21.9

## 2022-01-23 HISTORY — DX: Unspecified osteoarthritis, unspecified site: M19.90

## 2022-01-23 HISTORY — DX: Pneumonia, unspecified organism: J18.9

## 2022-01-23 LAB — BASIC METABOLIC PANEL
Anion gap: 8 (ref 5–15)
BUN: 30 mg/dL — ABNORMAL HIGH (ref 8–23)
CO2: 23 mmol/L (ref 22–32)
Calcium: 9.3 mg/dL (ref 8.9–10.3)
Chloride: 106 mmol/L (ref 98–111)
Creatinine, Ser: 1.03 mg/dL — ABNORMAL HIGH (ref 0.44–1.00)
GFR, Estimated: 58 mL/min — ABNORMAL LOW (ref >60)
Glucose, Bld: 139 mg/dL — ABNORMAL HIGH (ref 70–99)
Potassium: 3.1 mmol/L — ABNORMAL LOW (ref 3.5–5.1)
Sodium: 137 mmol/L (ref 135–145)

## 2022-01-23 LAB — SURGICAL PCR SCREEN
MRSA, PCR: NEGATIVE
Staphylococcus aureus: NEGATIVE

## 2022-01-23 LAB — CBC
HCT: 37.4 % (ref 36.0–46.0)
Hemoglobin: 12.6 g/dL (ref 12.0–15.0)
MCH: 32.1 pg (ref 26.0–34.0)
MCHC: 33.7 g/dL (ref 30.0–36.0)
MCV: 95.4 fL (ref 80.0–100.0)
Platelets: 229 10*3/uL (ref 150–400)
RBC: 3.92 MIL/uL (ref 3.87–5.11)
RDW: 13.1 % (ref 11.5–15.5)
WBC: 6.2 10*3/uL (ref 4.0–10.5)
nRBC: 0 % (ref 0.0–0.2)

## 2022-01-24 ENCOUNTER — Ambulatory Visit: Payer: PPO | Attending: Cardiology | Admitting: General Practice

## 2022-01-24 DIAGNOSIS — Z01810 Encounter for preprocedural cardiovascular examination: Secondary | ICD-10-CM

## 2022-01-24 NOTE — Progress Notes (Signed)
Virtual Visit via Telephone Note   Because of Sally Hopkins's co-morbid illnesses, she is at least at moderate risk for complications without adequate follow up.  This format is felt to be most appropriate for this patient at this time.  The patient did not have access to video technology/had technical difficulties with video requiring transitioning to audio format only (telephone).  All issues noted in this document were discussed and addressed.  No physical exam could be performed with this format.  Please refer to the patient's chart for her consent to telehealth for [facility].  Evaluation Performed:  Preoperative cardiovascular risk assessment _____________   Date:  01/24/2022   Patient ID:  [place], DOB 01-02-1951, MRN [id number] Patient Location:  Home Provider location:   Office  Primary Care Provider:  Elby Showers, MD Primary Cardiologist:  Minus Breeding, MD  Chief Complaint / Patient Profile   71 y.o. y/o female with a h/o hypertension, LVH, hyperlipidemia who is pending knee arthroplasty and presents today for telephonic preoperative cardiovascular risk assessment.  History of Present Illness    Sally Hopkins is a 72 y.o. female who presents via audio/video conferencing for a telehealth visit today.  Pt was last seen in cardiology clinic on 01/17/2021 by Dr. Percival Spanish.  At that time Sally Hopkins was doing well .  The patient is now pending procedure as outlined above. Since her last visit, she remained stable from a cardiac standpoint  Today she denies chest pain, shortness of breath, lower extremity edema, fatigue, palpitations, melena, hematuria, hemoptysis, diaphoresis, weakness, presyncope, syncope, orthopnea, and PND.   Past Medical History    Past Medical History:  Diagnosis Date   Arthritis    Cancer ([place])    melanoma   Dyslipidemia    GERD (gastroesophageal reflux disease)    HSV-1 infection    Hypertension    Meniere's  disease    Pneumonia    Past Surgical History:  Procedure Laterality Date   Cervical cryosurgery  2018   NASAL SEPTUM SURGERY  1971   TOTAL KNEE ARTHROPLASTY Right 01/30/2021   Procedure: TOTAL KNEE ARTHROPLASTY;  Surgeon: Paralee Cancel, MD;  Location: [facility];  Service: Orthopedics;  Laterality: Right;   WISDOM TOOTH EXTRACTION     72 yo    Allergies  Allergies  Allergen Reactions   Erythromycin     Unknown reaction   Macrobid  [Nitrofurantoin Macrocrystal]     Unknown reaction   Sulfa Antibiotics    Penicillins Rash   Thimerosol [Thimerosal (Thiomersal)] Rash    Home Medications    Prior to Admission medications   Medication Sig Start Date End Date Taking? Authorizing Provider  cetirizine (ZYRTEC) 10 MG tablet Take 10 mg by mouth daily.    [provider]  chlorthalidone (HYGROTON) 25 MG tablet TAKE ONE TABLET BY MOUTH ONCE DAILY 12/16/21   Elby Showers, MD  Cholecalciferol (VITAMIN D3) 50 MCG (2000 UT) CAPS Take 2,000 Units by mouth daily.    [provider]  Coenzyme Q10 (CO Q-10 PO) Take 1 capsule by mouth daily.    [provider]  COLLAGEN PO Take 1 Scoop by mouth daily.    [provider]  losartan (COZAAR) 100 MG tablet TAKE ONE TABLET BY MOUTH DAILY 12/16/21   Elby Showers, MD  metoprolol succinate (TOPROL-XL) 50 MG 24 hr tablet TAKE ONE TABLET BY MOUTH ONCE DAILY AFTER a meal 12/16/21   Baxley, Cresenciano Lick, MD  Multiple Vitamin (MULTIVITAMIN) capsule Take 1 capsule by mouth daily.    [provider]  NON FORMULARY Take 1 Scoop by mouth daily. Slim Microbiome activating Green coffee bean, garcinia cambogia, mulberry extract, alpha lipoic acid    [provider]  potassium chloride SA (KLOR-CON M) 20 MEQ tablet TAKE ONE TABLET BY MOUTH TWICE DAILY 12/16/21   Elby Showers, MD  rosuvastatin (CRESTOR) 20 MG tablet Take 1 tablet (20 mg total) by mouth daily. 09/15/21   Elby Showers, MD  valACYclovir (VALTREX) 500 MG  tablet TAKE ONE TABLET TWICE A DAY. 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 09/15/21   Baxley, Cresenciano Lick, MD  vitamin C (ASCORBIC ACID) 500 MG tablet Take 500 mg by mouth in the morning and at bedtime.    [provider]  zinc gluconate 50 MG tablet Take 50 mg by mouth in the morning and at bedtime.    [provider]    Physical Exam    Vital Signs:  Sally Hopkins does not have vital signs available for review today.  Given telephonic nature of communication, physical exam is limited. AAOx3. NAD. Normal affect.  Speech and respirations are unlabored.  Accessory Clinical Findings    None  Assessment & Plan    1.  Preoperative Cardiovascular Risk Assessment: Total knee arthroplasty, Dr. Mechele Claude      Primary Cardiologist: Minus Breeding, MD  Chart reviewed as part of pre-operative protocol coverage. Given past medical history and time since last visit, based on ACC/AHA guidelines, Sally Hopkins would be at acceptable risk for the planned procedure without further cardiovascular testing.   Patient was advised that if she develops new symptoms prior to surgery to contact our office to arrange a follow-up appointment.  He verbalized understanding.  Her RCRI is a class I risk, 0.4% risk of major cardiac event.  She is able to complete greater than 4 METS of physical activity.  I will route this recommendation to the requesting party via Epic fax function and remove from pre-op pool.       Time:   Today, I have spent  5 minutes with the patient with telehealth technology discussing medical history, symptoms, and management plan.  Prior to her phone evaluation I spent greater than 10 minutes reviewing her past medical history and cardiac medications.   Deberah Pelton, NP  01/24/2022, 8:14 AM

## 2022-01-28 NOTE — Progress Notes (Signed)
Anesthesia Chart Review   Case: [id number] Date/Time: 02/05/22 0700   Procedure: TOTAL KNEE ARTHROPLASTY (Left: Knee)   Anesthesia type: Spinal   Pre-op diagnosis: Left knee osteoarthritis   [facility] ROOM 09 / [facility]   Surgeons: Paralee Cancel, MD       DISCUSSION:72 y.o. never smoker with h/o HTN, left knee OA scheduled for above procedure 02/05/2022 with Dr. Paralee Cancel.   Per cardiology preoperative evaluation 01/24/2022, "Chart reviewed as part of pre-operative protocol coverage. Given past medical history and time since last visit, based on ACC/AHA guidelines, Shawntelle Ungar Moure would be at acceptable risk for the planned procedure without further cardiovascular testing.    Patient was advised that if she develops new symptoms prior to surgery to contact our office to arrange a follow-up appointment.  He verbalized understanding.   Her RCRI is a class I risk, 0.4% risk of major cardiac event.  She is able to complete greater than 4 METS of physical activity."  Anticipate pt can proceed with planned procedure barring acute status change.   VS: BP (!) 142/78   Pulse 75   Temp 36.8 C (Oral)   Resp 16   Ht '5\' 2"'$  (1.575 m)   Wt 70.3 kg   SpO2 100%   BMI 28.35 kg/m   PROVIDERS: Elby Showers, MD is PCP   Primary Cardiologist:  Minus Breeding, MD  LABS: Labs reviewed: Acceptable for surgery. (all labs ordered are listed, but only abnormal results are displayed)  Labs Reviewed  BASIC METABOLIC PANEL - Abnormal; Notable for the following components:      Result Value   Potassium 3.1 (*)    Glucose, Bld 139 (*)    BUN 30 (*)    Creatinine, Ser 1.03 (*)    GFR, Estimated 58 (*)    All other components within normal limits  SURGICAL PCR SCREEN  CBC     IMAGES:   EKG:   CV: Echo 06/26/2020 1. Left ventricular ejection fraction, by estimation, is 65 to 70%. The  left ventricle has normal function. The left ventricle has no regional  wall motion abnormalities.  There is moderate concentric left ventricular  hypertrophy. Left ventricular  diastolic parameters were normal. The average left ventricular global  longitudinal strain is -21.2 %. The global longitudinal strain is normal.   2. Right ventricular systolic function is normal. The right ventricular  size is normal. There is normal pulmonary artery systolic pressure.   3. Left atrial size was mildly dilated.   4. The mitral valve was not well visualized. Trivial mitral valve  regurgitation. No evidence of mitral stenosis. Moderate mitral annular  calcification.   5. The aortic valve is tricuspid. There is mild calcification of the  aortic valve. Aortic valve regurgitation is trivial. Mild aortic valve  sclerosis is present, with no evidence of aortic valve stenosis.   6. The inferior vena cava is normal in size with greater than 50%  respiratory variability, suggesting right atrial pressure of 3 mmHg.  Past Medical History:  Diagnosis Date   Arthritis    Cancer ([place])    melanoma   Dyslipidemia    GERD (gastroesophageal reflux disease)    HSV-1 infection    Hypertension    Meniere's disease    Pneumonia     Past Surgical History:  Procedure Laterality Date   Cervical cryosurgery  2018   NASAL SEPTUM SURGERY  1971   TOTAL KNEE ARTHROPLASTY Right 01/30/2021   Procedure: TOTAL  KNEE ARTHROPLASTY;  Surgeon: Paralee Cancel, MD;  Location[facility];  Service: Orthopedics;  Laterality: Right;   WISDOM TOOTH EXTRACTION     72 yo    MEDICATIONS:  cetirizine (ZYRTEC) 10 MG tablet   chlorthalidone (HYGROTON) 25 MG tablet   Cholecalciferol (VITAMIN D3) 50 MCG (2000 UT) CAPS   Coenzyme Q10 (CO Q-10 PO)   COLLAGEN PO   losartan (COZAAR) 100 MG tablet   metoprolol succinate (TOPROL-XL) 50 MG 24 hr tablet   Multiple Vitamin (MULTIVITAMIN) capsule   NON FORMULARY   potassium chloride SA (KLOR-CON M) 20 MEQ tablet   rosuvastatin (CRESTOR) 20 MG tablet   valACYclovir (VALTREX) 500 MG tablet    vitamin C (ASCORBIC ACID) 500 MG tablet   zinc gluconate 50 MG tablet   No current facility-administered medications for this encounter.     Konrad Felix Ward, PA-C [facility] Pre-Surgical Testing [phone]

## 2022-02-04 ENCOUNTER — Encounter (HOSPITAL_COMMUNITY): Payer: Self-pay | Admitting: Orthopedic Surgery

## 2022-02-04 NOTE — Anesthesia Preprocedure Evaluation (Signed)
Anesthesia Evaluation  Patient identified by MRN, date of birth, ID band Patient awake    Reviewed: Allergy & Precautions, NPO status , Patient's Chart, lab work & pertinent test results  History of Anesthesia Complications Negative for: history of anesthetic complications  Airway Mallampati: III       Dental  (+) Teeth Intact, Dental Advisory Given   Pulmonary neg pulmonary ROS   breath sounds clear to auscultation       Cardiovascular hypertension, (-) angina (-) Past MI and (-) Cardiac Stents + dysrhythmias (RBBB)  Rhythm:Regular Rate:Normal  HLD  TTE 06/26/2020: IMPRESSIONS     1. Left ventricular ejection fraction, by estimation, is 65 to 70%. The  left ventricle has normal function. The left ventricle has no regional  wall motion abnormalities. There is moderate concentric left ventricular  hypertrophy. Left ventricular  diastolic parameters were normal. The average left ventricular global  longitudinal strain is -21.2 %. The global longitudinal strain is normal.   2. Right ventricular systolic function is normal. The right ventricular  size is normal. There is normal pulmonary artery systolic pressure.   3. Left atrial size was mildly dilated.   4. The mitral valve was not well visualized. Trivial mitral valve  regurgitation. No evidence of mitral stenosis. Moderate mitral annular  calcification.   5. The aortic valve is tricuspid. There is mild calcification of the  aortic valve. Aortic valve regurgitation is trivial. Mild aortic valve  sclerosis is present, with no evidence of aortic valve stenosis.   6. The inferior vena cava is normal in size with greater than 50%  respiratory variability, suggesting right atrial pressure of 3 mmHg.     Neuro/Psych neg Seizures Meniere's disease    GI/Hepatic Neg liver ROS,GERD  ,,  Endo/Other  negative endocrine ROS    Renal/GU negative Renal ROS      Musculoskeletal  (+) Arthritis , Osteoarthritis,    Abdominal   Peds  Hematology negative hematology ROS (+)   Anesthesia Other Findings K 3.1   H/H 12.6/37.4 Platelets 229  Reproductive/Obstetrics                             Anesthesia Physical Anesthesia Plan  ASA: 2  Anesthesia Plan: MAC, Spinal and Regional   Post-op Pain Management: Tylenol PO (pre-op)* and Regional block*   Induction: Intravenous  PONV Risk Score and Plan: 2 and Ondansetron, Dexamethasone, Propofol infusion and Treatment may vary due to age or medical condition  Airway Management Planned: Natural Airway and Simple Face Mask  Additional Equipment:   Intra-op Plan:   Post-operative Plan:   Informed Consent: I have reviewed the patients History and Physical, chart, labs and discussed the procedure including the risks, benefits and alternatives for the proposed anesthesia with the patient or authorized representative who has indicated his/her understanding and acceptance.     Dental advisory given  Plan Discussed with: CRNA and Anesthesiologist  Anesthesia Plan Comments: (Discussed potential risks of nerve blocks including, but not limited to, infection, bleeding, nerve damage, seizures, pneumothorax, respiratory depression, and potential failure of the block. Alternatives to nerve blocks discussed. All questions answered.  I have discussed risks of neuraxial anesthesia including but not limited to infection, bleeding, nerve injury, back pain, headache, seizures, and failure of block. Patient denies bleeding disorders and is not currently anticoagulated. Labs have been reviewed. Risks and benefits discussed. All patient's questions answered.   Discussed with patient risks of  MAC including, but not limited to, minor pain or discomfort, hearing people in the room, and possible need for backup general anesthesia. Risks for general anesthesia also discussed including, but not  limited to, sore throat, hoarse voice, chipped/damaged teeth, injury to vocal cords, nausea and vomiting, allergic reactions, lung infection, heart attack, stroke, and death. All questions answered. )        Anesthesia Quick Evaluation

## 2022-02-05 ENCOUNTER — Ambulatory Visit (HOSPITAL_COMMUNITY)
Admission: RE | Admit: 2022-02-05 | Discharge: 2022-02-05 | Disposition: A | Discharge status: Home / Self Care | Payer: PPO | Source: Ambulatory Visit | Attending: Orthopedic Surgery | Admitting: Orthopedic Surgery

## 2022-02-05 ENCOUNTER — Encounter (HOSPITAL_COMMUNITY): Payer: Self-pay | Admitting: Orthopedic Surgery

## 2022-02-05 ENCOUNTER — Ambulatory Visit (HOSPITAL_BASED_OUTPATIENT_CLINIC_OR_DEPARTMENT_OTHER): Payer: PPO | Admitting: Anesthesiology

## 2022-02-05 ENCOUNTER — Encounter (HOSPITAL_COMMUNITY)
Admission: RE | Disposition: A | Discharge status: Home / Self Care | Payer: Self-pay | Source: Ambulatory Visit | Attending: Orthopedic Surgery

## 2022-02-05 ENCOUNTER — Ambulatory Visit (HOSPITAL_COMMUNITY): Payer: PPO | Admitting: Physician Assistant

## 2022-02-05 DIAGNOSIS — M1712 Unilateral primary osteoarthritis, left knee: Secondary | ICD-10-CM

## 2022-02-05 DIAGNOSIS — Z01818 Encounter for other preprocedural examination: Secondary | ICD-10-CM

## 2022-02-05 DIAGNOSIS — E785 Hyperlipidemia, unspecified: Secondary | ICD-10-CM | POA: Insufficient documentation

## 2022-02-05 DIAGNOSIS — H8109 Meniere's disease, unspecified ear: Secondary | ICD-10-CM | POA: Insufficient documentation

## 2022-02-05 DIAGNOSIS — Z96652 Presence of left artificial knee joint: Secondary | ICD-10-CM

## 2022-02-05 DIAGNOSIS — E876 Hypokalemia: Secondary | ICD-10-CM

## 2022-02-05 DIAGNOSIS — G8918 Other acute postprocedural pain: Secondary | ICD-10-CM | POA: Diagnosis not present

## 2022-02-05 HISTORY — PX: TOTAL KNEE ARTHROPLASTY: SHX125

## 2022-02-05 LAB — BASIC METABOLIC PANEL
Anion gap: 11 (ref 5–15)
BUN: 29 mg/dL — ABNORMAL HIGH (ref 8–23)
CO2: 19 mmol/L — ABNORMAL LOW (ref 22–32)
Calcium: 9.6 mg/dL (ref 8.9–10.3)
Chloride: 108 mmol/L (ref 98–111)
Creatinine, Ser: 1.02 mg/dL — ABNORMAL HIGH (ref 0.44–1.00)
GFR, Estimated: 59 mL/min — ABNORMAL LOW (ref >60)
Glucose, Bld: 160 mg/dL — ABNORMAL HIGH (ref 70–99)
Potassium: 3.1 mmol/L — ABNORMAL LOW (ref 3.5–5.1)
Sodium: 138 mmol/L (ref 135–145)

## 2022-02-05 SURGERY — ARTHROPLASTY, KNEE, TOTAL
Anesthesia: Monitor Anesthesia Care | Site: Knee | Laterality: Left

## 2022-02-05 MED ORDER — PROPOFOL 10 MG/ML IV BOLUS
INTRAVENOUS | Status: DC | PRN
  Administered 2022-02-05: 10 mg via INTRAVENOUS

## 2022-02-05 MED ORDER — EPINEPHRINE PF 1 MG/ML IJ SOLN
INTRAMUSCULAR | Status: AC
  Filled 2022-02-05: qty 1

## 2022-02-05 MED ORDER — METHOCARBAMOL 500 MG PO TABS
ORAL_TABLET | ORAL | Status: AC
  Filled 2022-02-05: qty 1

## 2022-02-05 MED ORDER — METOCLOPRAMIDE HCL 5 MG/ML IJ SOLN: 5.0000 mg | Freq: Three times a day (TID) | INTRAMUSCULAR | Status: DC | PRN

## 2022-02-05 MED ORDER — ORAL CARE MOUTH RINSE: 15.0000 mL | Freq: Once | OROMUCOSAL | Status: AC

## 2022-02-05 MED ORDER — PROPOFOL 10 MG/ML IV BOLUS
INTRAVENOUS | Status: AC
  Filled 2022-02-05: qty 20

## 2022-02-05 MED ORDER — OXYCODONE HCL 5 MG/5ML PO SOLN: 5.0000 mg | Freq: Once | ORAL | Status: AC | PRN

## 2022-02-05 MED ORDER — PROPOFOL 500 MG/50ML IV EMUL
INTRAVENOUS | Status: DC | PRN
  Administered 2022-02-05: 75 ug/kg/min via INTRAVENOUS

## 2022-02-05 MED ORDER — ONDANSETRON HCL 4 MG/2ML IJ SOLN
INTRAMUSCULAR | Status: DC | PRN
  Administered 2022-02-05: 4 mg via INTRAVENOUS

## 2022-02-05 MED ORDER — TRANEXAMIC ACID-NACL 1000-0.7 MG/100ML-% IV SOLN
1000.0000 mg | INTRAVENOUS | Status: AC
  Administered 2022-02-05: 1000 mg via INTRAVENOUS
  Filled 2022-02-05: qty 100

## 2022-02-05 MED ORDER — OXYCODONE HCL 5 MG PO TABS: 5.0000 mg | ORAL_TABLET | ORAL | Status: DC | PRN

## 2022-02-05 MED ORDER — TRANEXAMIC ACID-NACL 1000-0.7 MG/100ML-% IV SOLN: 1000.0000 mg | Freq: Once | INTRAVENOUS | Status: DC

## 2022-02-05 MED ORDER — PHENYLEPHRINE HCL-NACL 20-0.9 MG/250ML-% IV SOLN
INTRAVENOUS | Status: DC | PRN
  Administered 2022-02-05: 45 ug/min via INTRAVENOUS

## 2022-02-05 MED ORDER — SODIUM CHLORIDE 0.9 % IR SOLN
Status: DC | PRN
  Administered 2022-02-05: 1000 mL

## 2022-02-05 MED ORDER — MIDAZOLAM HCL 2 MG/2ML IJ SOLN
INTRAMUSCULAR | Status: AC
  Filled 2022-02-05: qty 2

## 2022-02-05 MED ORDER — OXYCODONE HCL 5 MG PO TABS
ORAL_TABLET | ORAL | Status: AC
  Filled 2022-02-05: qty 1

## 2022-02-05 MED ORDER — ACETAMINOPHEN 500 MG PO TABS
1000.0000 mg | ORAL_TABLET | Freq: Once | ORAL | Status: AC
  Administered 2022-02-05: 1000 mg via ORAL
  Filled 2022-02-05: qty 2

## 2022-02-05 MED ORDER — SODIUM CHLORIDE (PF) 0.9 % IJ SOLN
INTRAMUSCULAR | Status: DC | PRN
  Administered 2022-02-05: 30 mL via INTRAVENOUS

## 2022-02-05 MED ORDER — DEXAMETHASONE SODIUM PHOSPHATE 10 MG/ML IJ SOLN
INTRAMUSCULAR | Status: DC | PRN
  Administered 2022-02-05: 8 mg via INTRAVENOUS

## 2022-02-05 MED ORDER — ACETAMINOPHEN 325 MG PO TABS: 325.0000 mg | ORAL_TABLET | Freq: Four times a day (QID) | ORAL | Status: DC | PRN

## 2022-02-05 MED ORDER — PROPOFOL 1000 MG/100ML IV EMUL
INTRAVENOUS | Status: AC
  Filled 2022-02-05: qty 200

## 2022-02-05 MED ORDER — BUPIVACAINE IN DEXTROSE 0.75-8.25 % IT SOLN
INTRATHECAL | Status: DC | PRN
  Administered 2022-02-05: 1.6 mL via INTRATHECAL

## 2022-02-05 MED ORDER — LACTATED RINGERS IV SOLN: INTRAVENOUS | Status: DC

## 2022-02-05 MED ORDER — POVIDONE-IODINE 10 % EX SWAB: 2.0000 | Freq: Once | CUTANEOUS | Status: DC

## 2022-02-05 MED ORDER — OXYCODONE HCL 5 MG PO TABS: 10.0000 mg | ORAL_TABLET | ORAL | Status: DC | PRN

## 2022-02-05 MED ORDER — KETOROLAC TROMETHAMINE 30 MG/ML IJ SOLN
INTRAMUSCULAR | Status: DC | PRN
  Administered 2022-02-05: 30 mg

## 2022-02-05 MED ORDER — CEFAZOLIN SODIUM-DEXTROSE 2-4 GM/100ML-% IV SOLN: 2.0000 g | Freq: Four times a day (QID) | INTRAVENOUS | Status: DC

## 2022-02-05 MED ORDER — ACETAMINOPHEN 500 MG PO TABS
ORAL_TABLET | ORAL | Status: AC
  Filled 2022-02-05: qty 2

## 2022-02-05 MED ORDER — ACETAMINOPHEN 500 MG PO TABS
1000.0000 mg | ORAL_TABLET | Freq: Four times a day (QID) | ORAL | Status: DC
  Administered 2022-02-05: 1000 mg via ORAL

## 2022-02-05 MED ORDER — SODIUM CHLORIDE (PF) 0.9 % IJ SOLN
INTRAMUSCULAR | Status: AC
  Filled 2022-02-05: qty 30

## 2022-02-05 MED ORDER — ONDANSETRON HCL 4 MG PO TABS: 4.0000 mg | ORAL_TABLET | Freq: Four times a day (QID) | ORAL | Status: DC | PRN

## 2022-02-05 MED ORDER — FENTANYL CITRATE (PF) 100 MCG/2ML IJ SOLN
INTRAMUSCULAR | Status: DC | PRN
  Administered 2022-02-05: 50 ug via INTRAVENOUS

## 2022-02-05 MED ORDER — CEFAZOLIN SODIUM-DEXTROSE 2-4 GM/100ML-% IV SOLN
2.0000 g | INTRAVENOUS | Status: AC
  Administered 2022-02-05: 2 g via INTRAVENOUS
  Filled 2022-02-05: qty 100

## 2022-02-05 MED ORDER — AMISULPRIDE (ANTIEMETIC) 5 MG/2ML IV SOLN: 10.0000 mg | Freq: Once | INTRAVENOUS | Status: DC | PRN

## 2022-02-05 MED ORDER — METOCLOPRAMIDE HCL 5 MG PO TABS: 5.0000 mg | ORAL_TABLET | Freq: Three times a day (TID) | ORAL | Status: DC | PRN

## 2022-02-05 MED ORDER — MIDAZOLAM HCL 5 MG/5ML IJ SOLN
INTRAMUSCULAR | Status: DC | PRN
  Administered 2022-02-05 (×2): 1 mg via INTRAVENOUS

## 2022-02-05 MED ORDER — ROPIVACAINE HCL 5 MG/ML IJ SOLN
INTRAMUSCULAR | Status: DC | PRN
  Administered 2022-02-05: 20 mL via PERINEURAL

## 2022-02-05 MED ORDER — METHOCARBAMOL 500 MG PO TABS
500.0000 mg | ORAL_TABLET | Freq: Four times a day (QID) | ORAL | Status: DC | PRN
  Administered 2022-02-05: 500 mg via ORAL

## 2022-02-05 MED ORDER — LIDOCAINE HCL (PF) 2 % IJ SOLN
INTRAMUSCULAR | Status: AC
  Filled 2022-02-05: qty 5

## 2022-02-05 MED ORDER — HYDROMORPHONE HCL 1 MG/ML IJ SOLN: 0.5000 mg | INTRAMUSCULAR | Status: DC | PRN

## 2022-02-05 MED ORDER — ONDANSETRON HCL 4 MG/2ML IJ SOLN: 4.0000 mg | Freq: Four times a day (QID) | INTRAMUSCULAR | Status: DC | PRN

## 2022-02-05 MED ORDER — OXYCODONE HCL 5 MG PO TABS
5.0000 mg | ORAL_TABLET | Freq: Once | ORAL | Status: AC | PRN
  Administered 2022-02-05: 5 mg via ORAL

## 2022-02-05 MED ORDER — BUPIVACAINE-EPINEPHRINE (PF) 0.25% -1:200000 IJ SOLN
INTRAMUSCULAR | Status: DC | PRN
  Administered 2022-02-05: 30 mL via PERINEURAL

## 2022-02-05 MED ORDER — 0.9 % SODIUM CHLORIDE (POUR BTL) OPTIME
TOPICAL | Status: DC | PRN
  Administered 2022-02-05: 1000 mL

## 2022-02-05 MED ORDER — STERILE WATER FOR IRRIGATION IR SOLN
Status: DC | PRN
  Administered 2022-02-05: 2000 mL

## 2022-02-05 MED ORDER — LACTATED RINGERS IV BOLUS
250.0000 mL | Freq: Once | INTRAVENOUS | Status: AC
  Administered 2022-02-05: 250 mL via INTRAVENOUS

## 2022-02-05 MED ORDER — METHOCARBAMOL 500 MG IVPB - SIMPLE MED: 500.0000 mg | Freq: Four times a day (QID) | INTRAVENOUS | Status: DC | PRN

## 2022-02-05 MED ORDER — FENTANYL CITRATE (PF) 100 MCG/2ML IJ SOLN
INTRAMUSCULAR | Status: AC
  Filled 2022-02-05: qty 2

## 2022-02-05 MED ORDER — LACTATED RINGERS IV BOLUS
500.0000 mL | Freq: Once | INTRAVENOUS | Status: AC
  Administered 2022-02-05: 500 mL via INTRAVENOUS

## 2022-02-05 MED ORDER — BUPIVACAINE HCL (PF) 0.25 % IJ SOLN
INTRAMUSCULAR | Status: AC
  Filled 2022-02-05: qty 30

## 2022-02-05 MED ORDER — KETOROLAC TROMETHAMINE 30 MG/ML IJ SOLN
INTRAMUSCULAR | Status: AC
  Filled 2022-02-05: qty 1

## 2022-02-05 MED ORDER — CHLORHEXIDINE GLUCONATE 0.12 % MT SOLN
15.0000 mL | Freq: Once | OROMUCOSAL | Status: AC
  Administered 2022-02-05: 15 mL via OROMUCOSAL

## 2022-02-05 MED ORDER — DEXAMETHASONE SODIUM PHOSPHATE 10 MG/ML IJ SOLN: 8.0000 mg | Freq: Once | INTRAMUSCULAR | Status: DC

## 2022-02-05 MED ORDER — FENTANYL CITRATE PF 50 MCG/ML IJ SOSY: 25.0000 ug | PREFILLED_SYRINGE | INTRAMUSCULAR | Status: DC | PRN

## 2022-02-05 SURGICAL SUPPLY — 57 items
ADH SKN CLS APL DERMABOND .7 (GAUZE/BANDAGES/DRESSINGS) ×1
ATTUNE MED ANAT PAT 35 KNEE (Knees) IMPLANT
BAG COUNTER SPONGE SURGICOUNT (BAG) IMPLANT
BAG SPEC THK2 15X12 ZIP CLS (MISCELLANEOUS) ×1
BAG SPNG CNTER NS LX DISP (BAG) ×1
BAG ZIPLOCK 12X15 (MISCELLANEOUS) IMPLANT
BASEPLATE TIB CMT FB PCKT SZ3 (Knees) IMPLANT
BLADE SAW SGTL 11.0X1.19X90.0M (BLADE) IMPLANT
BLADE SAW SGTL 13.0X1.19X90.0M (BLADE) ×2 IMPLANT
BNDG ELASTIC 6X5.8 VLCR STR LF (GAUZE/BANDAGES/DRESSINGS) ×2 IMPLANT
BOWL SMART MIX CTS (DISPOSABLE) ×2 IMPLANT
BSPLAT TIB 3 CMNT FXBRNG STRL (Knees) ×1 IMPLANT
CEMENT HV SMART SET (Cement) IMPLANT
COMP FEM CMT ATTUNE NRS 4 LT (Joint) ×1 IMPLANT
COMPONENT FEM CMT ATTN NRS 4LT (Joint) IMPLANT
COOLER ICEMAN CLASSIC (MISCELLANEOUS) IMPLANT
COVER SURGICAL LIGHT HANDLE (MISCELLANEOUS) ×2 IMPLANT
CUFF TOURN SGL QUICK 34 (TOURNIQUET CUFF) ×1
CUFF TRNQT CYL 34X4.125X (TOURNIQUET CUFF) ×2 IMPLANT
DERMABOND ADVANCED .7 DNX12 (GAUZE/BANDAGES/DRESSINGS) ×2 IMPLANT
DRAPE U-SHAPE 47X51 STRL (DRAPES) ×2 IMPLANT
DRESSING AQUACEL AG SP 3.5X10 (GAUZE/BANDAGES/DRESSINGS) ×2 IMPLANT
DRSG AQUACEL AG ADV 3.5X10 (GAUZE/BANDAGES/DRESSINGS) IMPLANT
DRSG AQUACEL AG SP 3.5X10 (GAUZE/BANDAGES/DRESSINGS) ×1
DURAPREP 26ML APPLICATOR (WOUND CARE) ×4 IMPLANT
ELECT REM PT RETURN 15FT ADLT (MISCELLANEOUS) ×2 IMPLANT
GLOVE BIO SURGEON STRL SZ 6 (GLOVE) ×2 IMPLANT
GLOVE BIOGEL PI IND STRL 6.5 (GLOVE) ×2 IMPLANT
GLOVE BIOGEL PI IND STRL 7.5 (GLOVE) ×2 IMPLANT
GLOVE ORTHO TXT STRL SZ7.5 (GLOVE) ×4 IMPLANT
GOWN STRL REUS W/ TWL LRG LVL3 (GOWN DISPOSABLE) ×4 IMPLANT
GOWN STRL REUS W/TWL LRG LVL3 (GOWN DISPOSABLE) ×2
HANDPIECE INTERPULSE COAX TIP (DISPOSABLE) ×1
HOLDER FOLEY CATH W/STRAP (MISCELLANEOUS) IMPLANT
INSERT MED ATTUNE KNEE 4 7 LT (Insert) IMPLANT
KIT TURNOVER KIT A (KITS) IMPLANT
MANIFOLD NEPTUNE II (INSTRUMENTS) ×2 IMPLANT
NDL SAFETY ECLIP 18X1.5 (MISCELLANEOUS) IMPLANT
NS IRRIG 1000ML POUR BTL (IV SOLUTION) ×2 IMPLANT
PACK TOTAL KNEE CUSTOM (KITS) ×2 IMPLANT
PAD COLD SHLDR WRAP-ON (PAD) IMPLANT
PIN FIX SIGMA LCS THRD HI (PIN) IMPLANT
PROTECTOR NERVE ULNAR (MISCELLANEOUS) ×2 IMPLANT
SET HNDPC FAN SPRY TIP SCT (DISPOSABLE) ×2 IMPLANT
SET PAD KNEE POSITIONER (MISCELLANEOUS) ×2 IMPLANT
SPIKE FLUID TRANSFER (MISCELLANEOUS) ×4 IMPLANT
SUT MNCRL AB 4-0 PS2 18 (SUTURE) ×2 IMPLANT
SUT STRATAFIX PDS+ 0 24IN (SUTURE) ×2 IMPLANT
SUT VIC AB 1 CT1 36 (SUTURE) ×2 IMPLANT
SUT VIC AB 2-0 CT1 27 (SUTURE) ×2
SUT VIC AB 2-0 CT1 TAPERPNT 27 (SUTURE) ×4 IMPLANT
SYR 3ML LL SCALE MARK (SYRINGE) ×2 IMPLANT
TOWEL GREEN STERILE FF (TOWEL DISPOSABLE) ×2 IMPLANT
TRAY FOLEY MTR SLVR 16FR STAT (SET/KITS/TRAYS/PACK) ×2 IMPLANT
TUBE SUCTION HIGH CAP CLEAR NV (SUCTIONS) ×2 IMPLANT
WATER STERILE IRR 1000ML POUR (IV SOLUTION) ×4 IMPLANT
WRAP KNEE MAXI GEL POST OP (GAUZE/BANDAGES/DRESSINGS) ×2 IMPLANT

## 2022-02-05 NOTE — Evaluation (Signed)
Physical Therapy Evaluation Patient Details Name: Sally Hopkins MRN: [id number] DOB: [birth year] Today's Date: 02/05/2022  History of Present Illness  72 y.o. female admitted 02/05/22 for L TKA. PMH: R TKA 01/30/21, HTN, menieres dz  Clinical Impression  Pt is mobilizing well and is ready to DC home from a PT standpoint. She ambulated 150' with RW, completed stair training, and demonstrates good understanding of HEP.        Recommendations for follow up therapy are one component of a multi-disciplinary discharge planning process, led by the attending physician.  Recommendations may be updated based on patient status, additional functional criteria and insurance authorization.  Follow Up Recommendations Outpatient PT      Assistance Recommended at Discharge Intermittent Supervision/Assistance  Patient can return home with the following  A little help with bathing/dressing/bathroom;Assistance with cooking/housework;Assist for transportation;Help with stairs or ramp for entrance    Equipment Recommendations None recommended by PT  Recommendations for Other Services       Functional Status Assessment Patient has had a recent decline in their functional status and demonstrates the ability to make significant improvements in function in a reasonable and predictable amount of time.     Precautions / Restrictions Precautions Precautions: Knee Precaution Booklet Issued: Yes (comment) Precaution Comments: reviewed no pillow under knee Restrictions Weight Bearing Restrictions: No Other Position/Activity Restrictions: WBAT      Mobility  Bed Mobility Overal bed mobility: Modified Independent             General bed mobility comments: HOB up    Transfers Overall transfer level: Needs assistance Equipment used: Rolling walker (2 wheels) Transfers: Sit to/from Stand Sit to Stand: Supervision           General transfer comment: VCs hand placement     Ambulation/Gait Ambulation/Gait assistance: Supervision Gait Distance (Feet): 150 Feet Assistive device: Rolling walker (2 wheels) Gait Pattern/deviations: Step-to pattern       General Gait Details: VCs sequencing, no loss of balance, no buckling of L knee  Stairs Stairs: Yes Stairs assistance: Min assist   Number of Stairs: 2 General stair comments: Min A to steady RW, spouse present and assisted with steadying RW, VCs sequencing  Wheelchair Mobility    Modified Rankin (Stroke Patients Only)       Balance Overall balance assessment: Modified Independent                                           Pertinent Vitals/Pain Pain Assessment Pain Assessment: 0-10 Pain Score: 5  Pain Location: L thigh Pain Descriptors / Indicators: Sore Pain Intervention(s): Limited activity within patient's tolerance, Monitored during session, Premedicated before session    Home Living Family/patient expects to be discharged to:: Private residence Living Arrangements: Spouse/significant other Available Help at Discharge: Family;Available 24 hours/day Type of Home: House Home Access: Stairs to enter Entrance Stairs-Rails: None Entrance Stairs-Number of Steps: 3   Home Layout: Two level;Able to live on main level with bedroom/bathroom Home Equipment: Rolling Walker (2 wheels)      Prior Function Prior Level of Function : Independent/Modified Independent             Mobility Comments: walked without AD, denies falls in past 6 months       Hand Dominance        Extremity/Trunk Assessment   Upper Extremity Assessment Upper Extremity Assessment: Overall [facility]  for tasks assessed    Lower Extremity Assessment Lower Extremity Assessment: LLE deficits/detail LLE Deficits / Details: SLR 3/5, knee AAROM 5-50* LLE Sensation: WNL LLE Coordination: WNL    Cervical / Trunk Assessment Cervical / Trunk Assessment: Normal  Communication   Communication: No  difficulties  Cognition Arousal/Alertness: Awake/alert Behavior During Therapy: WFL for tasks assessed/performed Overall Cognitive Status: Within Functional Limits for tasks assessed                                          General Comments      Exercises Total Joint Exercises Ankle Circles/Pumps: AROM, Both, 10 reps, Supine Quad Sets: AROM, Both, 5 reps, Supine Short Arc Quad: AROM, Left, 5 reps, Supine Heel Slides: AAROM, Left, 5 reps, Supine Hip ABduction/ADduction: AAROM, Left, 5 reps, Supine Straight Leg Raises: AAROM, Left, 5 reps, Supine Long Arc Quad: AROM, Left, 5 reps, Seated Knee Flexion: AAROM, Left, 10 reps, Seated Goniometric ROM: 5-50* AAROM L knee   Assessment/Plan    PT Assessment All further PT needs can be met in the next venue of care  PT Problem List Decreased mobility;Decreased range of motion;Pain       PT Treatment Interventions      PT Goals (Current goals can be found in the Care Plan section)  Acute Rehab PT Goals PT Goal Formulation: All assessment and education complete, DC therapy    Frequency       Co-evaluation               AM-PAC PT "6 Clicks" Mobility  Outcome Measure Help needed turning from your back to your side while in a flat bed without using bedrails?: None Help needed moving from lying on your back to sitting on the side of a flat bed without using bedrails?: A Little Help needed moving to and from a bed to a chair (including a wheelchair)?: A Little Help needed standing up from a chair using your arms (e.g., wheelchair or bedside chair)?: None Help needed to walk in hospital room?: None Help needed climbing 3-5 steps with a railing? : A Little 6 Click Score: 21    End of Session Equipment Utilized During Treatment: Gait belt Activity Tolerance: Patient tolerated treatment well Patient left: in chair;with call bell/phone within reach;with family/visitor present Nurse Communication: Mobility  status PT Visit Diagnosis: Difficulty in walking, not elsewhere classified (R26.2);Pain Pain - Right/Left: Left Pain - part of body: Knee    Time: 3810-1751 PT Time Calculation (min) (ACUTE ONLY): 39 min   Charges:   PT Evaluation $PT Eval Moderate Complexity: 1 Mod PT Treatments $Gait Training: 8-22 mins $Therapeutic Exercise: 8-22 mins       Blondell Reveal Kistler PT 02/05/2022  Acute Rehabilitation Services  Office [phone]

## 2022-02-05 NOTE — Anesthesia Procedure Notes (Signed)
Anesthesia Regional Block: Adductor canal block   Pre-Anesthetic Checklist: , timeout performed,  Correct Patient, Correct Site, Correct Laterality,  Correct Procedure, Correct Position, site marked,  Risks and benefits discussed,  Surgical consent,  Pre-op evaluation,  At surgeon's request and post-op pain management  Laterality: Left  Prep: chloraprep       Needles:  Injection technique: Single-shot  Needle Type: Echogenic Stimulator Needle     Needle Length: 9cm  Needle Gauge: 21     Additional Needles:   Procedures:,,,, ultrasound used (permanent image in chart),,    Narrative:  Start time: 02/05/2022 6:51 AM End time: 02/05/2022 6:52 AM Injection made incrementally with aspirations every 5 mL.  Performed by: Personally  Anesthesiologist: Nilda Simmer, MD  Additional Notes: Discussed risks and benefits of nerve block including, but not limited to, prolonged and/or permanent nerve injury involving sensory and/or motor function. Monitors were applied and a time-out was performed. The nerve and associated structures were visualized under ultrasound guidance. After negative aspiration, local anesthetic was slowly injected around the nerve. There was no evidence of high pressure during the procedure. There were no paresthesias. VSS remained stable and the patient tolerated the procedure well.

## 2022-02-05 NOTE — Op Note (Signed)
NAME:  [name] RECORD NO.:  [id number]                             FACILITY:  [facility]      PHYSICIAN:  Pietro Cassis. Alvan Dame, M.D.  DATE OF BIRTH:  11-10-1950      DATE OF PROCEDURE:  02/05/2022                                     OPERATIVE REPORT         PREOPERATIVE DIAGNOSIS:  Left knee osteoarthritis.      POSTOPERATIVE DIAGNOSIS:  Left knee osteoarthritis.      FINDINGS:  The patient was noted to have complete loss of cartilage and   bone-on-bone arthritis with associated osteophytes in the medial and patellofemoral compartments of   the knee with a significant synovitis and associated effusion.  The patient had failed months of conservative treatment including medications, injection therapy, activity modification.     PROCEDURE:  Left total knee replacement.      COMPONENTS USED:  DePuy Attune MS CR knee   system, a size 4N femur, 3 tibia, size 7 mm CR MS AOX insert, and 35 anatomic patellar   button.      SURGEON:  Pietro Cassis. Alvan Dame, M.D.      ASSISTANT:  Costella Hatcher, PA-C.      ANESTHESIA:  Regional and Spinal.      SPECIMENS:  None.      COMPLICATION:  None.      DRAINS:  None.  EBL: <200 cc      TOURNIQUET TIME:   Total Tourniquet Time Documented: Thigh (Left) - 29 minutes Total: Thigh (Left) - 29 minutes  .      The patient was stable to the recovery room.      INDICATION FOR PROCEDURE:  Sally Hopkins is a 72 y.o. female patient of   mine.  The patient had been seen, evaluated, and treated for months conservatively in the   office with medication, activity modification, and injections.  The patient had   radiographic changes of bone-on-bone arthritis with endplate sclerosis and osteophytes noted.  Based on the radiographic changes and failed conservative measures, the patient   decided to proceed with definitive treatment, total knee replacement.  Risks of infection, DVT, component failure, need for revision surgery,  neurovascular injury were reviewed in the office setting.  The postop course was reviewed stressing the efforts to maximize post-operative satisfaction and function.  Consent was obtained for benefit of pain   relief.      PROCEDURE IN DETAIL:  The patient was brought to the operative theater.   Once adequate anesthesia, preoperative antibiotics, 2 gm of Ancef,1 gm of Tranexamic Acid, and 10 mg of Decadron administered, the patient was positioned supine with a left thigh tourniquet placed.  The  left lower extremity was prepped and draped in sterile fashion.  A time-   out was performed identifying the patient, planned procedure, and the appropriate extremity.      The left lower extremity was placed in the [facility] leg holder.  The leg was   exsanguinated, tourniquet elevated to 225 mmHg.  A midline incision was   made  followed by median parapatellar arthrotomy.  Following initial   exposure, attention was first directed to the patella.  Precut   measurement was noted to be 22 mm.  I resected down to 13 mm and used a   35 anatomic patellar button to restore patellar height as well as cover the cut surface.      The lug holes were drilled and a metal shim was placed to protect the   patella from retractors and saw blade during the procedure.      At this point, attention was now directed to the femur.  The femoral   canal was opened with a drill, irrigated to try to prevent fat emboli.  An   intramedullary rod was passed at 3 degrees valgus, 9 mm of bone was   resected off the distal femur.  Following this resection, the tibia was   subluxated anteriorly.  Using the extramedullary guide, 2 mm of bone was resected off   the proximal medial tibia.  We confirmed the gap would be   stable medially and laterally with a size 5 spacer block as well as confirmed that the tibial cut was perpendicular in the coronal plane, checking with an alignment rod.      Once this was done, I sized the femur to be  a size 4 in the anterior-   posterior dimension, chose a narrow component based on medial and   lateral dimension.  The size 4 rotation block was then pinned in   position anterior referenced using the C-clamp to set rotation.  The   anterior, posterior, and  chamfer cuts were made without difficulty nor   notching making certain that I was along the anterior cortex to help   with flexion gap stability.      The final shim cut was made for the CR implant based off the lateral aspect of the femur.     At this point, with the trial femur in place I floated the tibial trial with a 6 mm insert to set tibial rotation and marked it on the anterior cortex.  Following this step I sized the tibia to be a size 3.  The size 3 tray was   then pinned in position directed to area marked on the tibia,   drilled, and keel punched.  Trial reduction was now carried with a 4 femur,  3 tibia, a size 7 mm CR MS insert, and the 35 anatomic patella botton.  The knee was brought to full extension with good flexion stability with the patella   tracking through the trochlea without application of pressure.  Given   all these findings the trial components removed.  Final components were   opened and cement was mixed.  The knee was irrigated with normal saline solution and pulse lavage.  The synovial lining was   then injected with 30 cc of 0.25% Marcaine with epinephrine, 1 cc of Toradol and 30 cc of NS for a total of 61 cc.     Final implants were then cemented onto cleaned and dried cut surfaces of bone with the knee brought to extension with a size 7 mm CR MS trial insert.      Once the cement had fully cured, excess cement was removed   throughout the knee.  I confirmed that I was satisfied with the range of   motion and stability, and the final size 7 mm CR MS AOX insert was chosen.  It was  placed into the knee.      The tourniquet had been let down at 29 minutes.  No significant   hemostasis was required.   The extensor mechanism was then reapproximated using #1 Vicryl and #1 Stratafix sutures with the knee   in flexion.  The   remaining wound was closed with 2-0 Vicryl and running 4-0 Monocryl.   The knee was cleaned, dried, dressed sterilely using Dermabond and   Aquacel dressing.  The patient was then   brought to recovery room in stable condition, tolerating the procedure   well.   Please note that Physician Assistant, Costella Hatcher, PA-C was present for the entirety of the case, and was utilized for pre-operative positioning, peri-operative retractor management, general facilitation of the procedure and for primary wound closure at the end of the case.              Pietro Cassis Alvan Dame, M.D.    02/05/2022 8:32 AM

## 2022-02-05 NOTE — Transfer of Care (Signed)
Immediate Anesthesia Transfer of Care Note  Patient: Sally Hopkins  Procedure(s) Performed: Procedure(s): TOTAL KNEE ARTHROPLASTY (Left)  Patient Location: PACU  Anesthesia Type:Spinal  Level of Consciousness:  sedated, patient cooperative and responds to stimulation  Airway & Oxygen Therapy:Patient Spontanous Breathing and Patient connected to face mask oxgen  Post-op Assessment:  Report given to PACU RN and Post -op Vital signs reviewed and stable  Post vital signs:  Reviewed and stable  Last Vitals:  Vitals:   02/05/22 0552  BP: (!) 142/76  Pulse: 85  Resp: 18  Temp: 36.5 C  SpO2: 29%    Complications: No apparent anesthesia complications

## 2022-02-05 NOTE — Anesthesia Postprocedure Evaluation (Signed)
Anesthesia Post Note  Patient: Sally Hopkins  Procedure(s) Performed: TOTAL KNEE ARTHROPLASTY (Left: Knee)     Patient location during evaluation: PACU Anesthesia Type: Regional, MAC and Spinal Level of consciousness: awake Pain management: pain level controlled Vital Signs Assessment: post-procedure vital signs reviewed and stable Respiratory status: spontaneous breathing, respiratory function stable and nonlabored ventilation Cardiovascular status: blood pressure returned to baseline and stable Postop Assessment: no headache, no backache and no apparent nausea or vomiting Anesthetic complications: no   No notable events documented.  Last Vitals:  Vitals:   02/05/22 1000 02/05/22 1015  BP: 115/60 126/72  Pulse: 66 70  Resp: 13 14  Temp:    SpO2: 99% 100%    Last Pain:  Vitals:   02/05/22 1000  TempSrc:   PainSc: 0-No pain                 Nilda Simmer

## 2022-02-05 NOTE — Discharge Instructions (Signed)

## 2022-02-05 NOTE — H&P (Signed)
TOTAL KNEE ADMISSION H&P  Patient is being admitted for left total knee arthroplasty.  Subjective:  Chief Complaint:left knee pain.  HPI: Sally Hopkins, 72 y.o. female, has a history of pain and functional disability in the left knee due to arthritis and has failed non-surgical conservative treatments for greater than 12 weeks to includeNSAID's and/or analgesics, corticosteriod injections, and activity modification.  Onset of symptoms was gradual, starting 2 years ago with gradually worsening course since that time. The patient noted no past surgery on the left knee(s).  Patient currently rates pain in the left knee(s) at 7 out of 10 with activity. Patient has worsening of pain with activity and weight bearing and pain that interferes with activities of daily living.  Patient has evidence of joint space narrowing by imaging studies. There is no active infection.  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   Allergic rhinitis 09/08/2011   Hypertension 07/31/2010   Meniere's disease 07/31/2010   Past Medical History:  Diagnosis Date   Arthritis    Cancer ([place])    melanoma   Dyslipidemia    GERD (gastroesophageal reflux disease)    HSV-1 infection    Hypertension    Meniere's disease    Pneumonia     Past Surgical History:  Procedure Laterality Date   Cervical cryosurgery  2018   NASAL SEPTUM SURGERY  1971   TOTAL KNEE ARTHROPLASTY Right 01/30/2021   Procedure: TOTAL KNEE ARTHROPLASTY;  Surgeon: Paralee Cancel, MD;  Location: [facility];  Service:  Orthopedics;  Laterality: Right;   WISDOM TOOTH EXTRACTION     72 yo    Current Facility-Administered Medications  Medication Dose Route Frequency Provider Last Rate Last Admin   ceFAZolin (ANCEF) IVPB 2g/100 mL premix  2 g Intravenous On Call to OR Irving Copas, PA-C       dexamethasone (DECADRON) injection 8 mg  8 mg Intravenous Once Irving Copas, PA-C       lactated ringers infusion   Intravenous Continuous Irving Copas, PA-C       lactated ringers infusion   Intravenous Continuous Lynda Rainwater, MD 10 mL/hr at 02/05/22 0617 Continued from Pre-op at 02/05/22 0617   povidone-iodine 10 % swab 2 Application  2 Application Topical Once Irving Copas, PA-C       tranexamic acid (CYKLOKAPRON) IVPB 1,000 mg  1,000 mg Intravenous To OR Irving Copas, PA-C       Facility-Administered Medications Ordered in Other Encounters  Medication Dose Route Frequency Provider Last Rate Last Admin   fentaNYL (SUBLIMAZE) injection   Intravenous Anesthesia Intra-op Key, Kristopher, CRNA   50 mcg at 02/05/22 0651   midazolam (VERSED) 5 MG/5ML injection   Intravenous Anesthesia Intra-op Key, Kristopher, CRNA   1 mg at 02/05/22 0651   ondansetron (ZOFRAN) injection   Intravenous Anesthesia Intra-op Key, Kristopher, CRNA   4 mg at 02/05/22 8657   Allergies  Allergen Reactions   Erythromycin     Unknown reaction   Macrobid  [Nitrofurantoin Macrocrystal]     Unknown  reaction   Sulfa Antibiotics    Penicillins Rash   Thimerosol [Thimerosal (Thiomersal)] Rash    Social History   Tobacco Use   Smoking status: Never   Smokeless tobacco: Never  Substance Use Topics   Alcohol use: Yes    Comment: social    Family History  Problem Relation Age of Onset   ALS Father    Heart disease Mother        Congenital , died post op.       Review of Systems  Constitutional:  Negative for chills and fever.  Respiratory:  Negative for cough and shortness of breath.   Cardiovascular:   Negative for chest pain.  Gastrointestinal:  Negative for nausea and vomiting.  Musculoskeletal:  Positive for arthralgias.     Objective:  Physical Exam Well nourished and well developed. General: Alert and oriented x3, cooperative and pleasant, no acute distress. Head: normocephalic, atraumatic, neck supple. Eyes: EOMI.  Musculoskeletal: Left knee exam: Slight genu varum associated with tenderness medially No significant effusion warmth or erythema Near full active and passive extension with flexion to 120 degrees  Calves soft and nontender. Motor function intact in LE. Strength 5/5 LE bilaterally. Neuro: Distal pulses 2+. Sensation to light touch intact in LE.  Vital signs in last 24 hours: Temp:  [97.7 F (36.5 C)] 97.7 F (36.5 C) (01/30 0552) Pulse Rate:  [85] 85 (01/30 0552) Resp:  [18] 18 (01/30 0552) BP: (142)/(76) 142/76 (01/30 0552) SpO2:  [97 %] 97 % (01/30 0552) Weight:  [70.3 kg] 70.3 kg (01/30 0552)  Labs:   Estimated body mass index is 28.35 kg/m as calculated from the following:   Height as of this encounter: '5\' 2"'$  (1.575 m).   Weight as of this encounter: 70.3 kg.   Imaging Review Plain radiographs demonstrate severe degenerative joint disease of the left knee(s). The overall alignment isneutral. The bone quality appears to be adequate for age and reported activity level.      Assessment/Plan:  End stage arthritis, left knee   The patient history, physical examination, clinical judgment of the provider and imaging studies are consistent with end stage degenerative joint disease of the left knee(s) and total knee arthroplasty is deemed medically necessary. The treatment options including medical management, injection therapy arthroscopy and arthroplasty were discussed at length. The risks and benefits of total knee arthroplasty were presented and reviewed. The risks due to aseptic loosening, infection, stiffness, patella tracking problems,  thromboembolic complications and other imponderables were discussed. The patient acknowledged the explanation, agreed to proceed with the plan and consent was signed. Patient is being admitted for inpatient treatment for surgery, pain control, PT, OT, prophylactic antibiotics, VTE prophylaxis, progressive ambulation and ADL's and discharge planning. The patient is planning to be discharged  home.  Therapy Plans: outpatient therapy EO Disposition: Home with husband Planned DVT Prophylaxis: aspirin '81mg'$  BID DME needed: none PCP: Dr. Renold Genta, clearance received Cardio: Dr. Percival Spanish, clearance received TXA: IV Allergies: erythromycin - , sulfa - itching, PCN - childhood, thimerosol - Anesthesia Concerns: none BMI: 29.2 Last HgbA1c: Not diabetic   Other: - SDD - oxycodone, robaxin, tylenol, meloxicam (has aspirin) -- will let me know if I should send - [facility] [place] - 5FU on skin area on her face    Patient's anticipated LOS is less than 2 midnights, meeting these requirements: - Younger than 87 - Lives within 1 hour of care - Has a competent adult at home to recover with  post-op recover - NO history of  - Chronic pain requiring opiods  - Diabetes  - Coronary Artery Disease  - Heart failure  - Heart attack  - Stroke  - DVT/VTE  - Cardiac arrhythmia  - Respiratory Failure/COPD  - Renal failure  - Anemia  - Advanced Liver disease  Costella Hatcher, PA-C Orthopedic Surgery [facility] [place] [phone]

## 2022-02-05 NOTE — Anesthesia Procedure Notes (Addendum)
Spinal  Patient location during procedure: OR Start time: 02/05/2022 7:15 AM End time: 02/05/2022 7:16 AM Reason for block: surgical anesthesia Staffing Performed: anesthesiologist  Anesthesiologist: Nilda Simmer, MD Performed by: Nilda Simmer, MD Authorized by: Nilda Simmer, MD   Preanesthetic Checklist Completed: patient identified, IV checked, site marked, risks and benefits discussed, surgical consent, monitors and equipment checked, pre-op evaluation and timeout performed Spinal Block Patient position: sitting Prep: DuraPrep Patient monitoring: blood pressure and continuous pulse ox Approach: midline Location: L3-4 Injection technique: single-shot Needle Needle type: Pencan  Needle gauge: 24 G Needle length: 9 cm Additional Notes Risks and benefits of neuraxial anesthesia including, but not limited to, infection, bleeding, local anesthetic toxicity, headache, hypotension, back pain, block failure, etc. were discussed with the patient. The patient expressed understanding and consented to the procedure. I confirmed that the patient has no bleeding disorders and is not taking blood thinners. I confirmed the patient's last platelet count with the nurse. Monitors were applied. A time-out was performed immediately prior to the procedure. Sterile technique was used throughout the whole procedure.   __1_ attempt(s)

## 2022-02-08 ENCOUNTER — Encounter (HOSPITAL_COMMUNITY): Payer: Self-pay | Admitting: Orthopedic Surgery

## 2022-02-08 DIAGNOSIS — M25562 Pain in left knee: Secondary | ICD-10-CM | POA: Diagnosis not present

## 2022-02-11 DIAGNOSIS — M25562 Pain in left knee: Secondary | ICD-10-CM | POA: Diagnosis not present

## 2022-02-13 DIAGNOSIS — M25562 Pain in left knee: Secondary | ICD-10-CM | POA: Diagnosis not present

## 2022-02-15 DIAGNOSIS — M25562 Pain in left knee: Secondary | ICD-10-CM | POA: Diagnosis not present

## 2022-02-18 DIAGNOSIS — M25562 Pain in left knee: Secondary | ICD-10-CM | POA: Diagnosis not present

## 2022-02-20 DIAGNOSIS — M25562 Pain in left knee: Secondary | ICD-10-CM | POA: Diagnosis not present

## 2022-02-22 DIAGNOSIS — M25562 Pain in left knee: Secondary | ICD-10-CM | POA: Diagnosis not present

## 2022-02-26 ENCOUNTER — Encounter (HOSPITAL_COMMUNITY): Payer: Self-pay | Admitting: Orthopedic Surgery

## 2022-02-26 DIAGNOSIS — M25562 Pain in left knee: Secondary | ICD-10-CM | POA: Diagnosis not present

## 2022-02-28 DIAGNOSIS — M25562 Pain in left knee: Secondary | ICD-10-CM | POA: Diagnosis not present

## 2022-03-04 DIAGNOSIS — M25562 Pain in left knee: Secondary | ICD-10-CM | POA: Diagnosis not present

## 2022-03-07 DIAGNOSIS — M25562 Pain in left knee: Secondary | ICD-10-CM | POA: Diagnosis not present

## 2022-03-11 DIAGNOSIS — M25562 Pain in left knee: Secondary | ICD-10-CM | POA: Diagnosis not present

## 2022-03-14 DIAGNOSIS — M25562 Pain in left knee: Secondary | ICD-10-CM | POA: Diagnosis not present

## 2022-03-16 ENCOUNTER — Other Ambulatory Visit: Payer: Self-pay | Admitting: Internal Medicine

## 2022-03-18 DIAGNOSIS — M25562 Pain in left knee: Secondary | ICD-10-CM | POA: Diagnosis not present

## 2022-03-20 DIAGNOSIS — Z471 Aftercare following joint replacement surgery: Secondary | ICD-10-CM | POA: Diagnosis not present

## 2022-03-20 DIAGNOSIS — Z96652 Presence of left artificial knee joint: Secondary | ICD-10-CM | POA: Diagnosis not present

## 2022-03-20 DIAGNOSIS — Z96651 Presence of right artificial knee joint: Secondary | ICD-10-CM | POA: Diagnosis not present

## 2022-03-21 DIAGNOSIS — M25562 Pain in left knee: Secondary | ICD-10-CM | POA: Diagnosis not present

## 2022-05-21 NOTE — Progress Notes (Addendum)
Patient Care Team: Margaree Mackintosh, MD as PCP - General (Internal Medicine) Rollene Rotunda, MD as PCP - Cardiology (Cardiology)  Visit Date: 05/28/22  Subjective:    Patient ID: Sally Hopkins , Female   DOB: 06-11-1950, 72 y.o.    MRN: [id number]   72 y.o. Female presents today for 6 month follow-up.   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 Reports she is recovering well.  Requesting refill of alprazolam.  History of hyperlipidemia treated with rosuvastatin 20 mg daily. Lipid panel normal on 05/27/22.  Liver functions normal. HGBA1c at 5.8% on 05/27/22, up from 5.7% 6 months ago. Concerned about low potassium, 3.1 on 02/05/22. History of hypokalemia. Would like to add this to blood work if possible. Taking Klor-Con 20 meq twice daily.  History of hypertension treated with losartan 100 mg daily, chlorthalidone 25 mg daily, metoprolol succinate 50 mg daily after a meal. Blood pressure normal today at 124/74.  In February 2021, she had a reaction after receiving second COVID-vaccine with fever, chills, myalgias, malaise and fatigue that was protracted.  She saw Dr. Antoine Poche to rule out myocarditis.  He did not feel she had myocarditis.  She had a low-grade temp up to 100.5 degrees.  COVID test was negative as well as flu test.  Echocardiogram was normal and she subsequently recovered without sequela.   She has a history of Mnire's disease which is stable and in remission.  History of hypertension, hyperlipidemia, Herpes simplex type II.  She takes potassium supplement due to hypokalemia from diuretic therapy.  Social history: Married with 2 adult children.  Does not smoke.  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  Social alcohol consumption.   Family history: Father died at age 79 of ALS but also had colon cancer.  Mother with history of open heart surgery  1969 for congenital heart disease and also had CABG.  1 sister in good health.  Past Medical History:  Diagnosis Date   Arthritis    Cancer (HCC)    melanoma   Dyslipidemia    GERD (gastroesophageal reflux disease)    HSV-1 infection    Hypertension    Meniere's disease    Pneumonia      Family History  Problem Relation Age of Onset   ALS Father    Heart disease Mother        Congenital , died post op.      Social History   Social History Narrative   Lives with husband.        Review of Systems  Constitutional:  Negative for fever and malaise/fatigue.  HENT:  Negative for congestion.   Eyes:  Negative for blurred vision.  Respiratory:  Negative for cough and shortness of breath.   Cardiovascular:  Negative for chest pain, palpitations and leg swelling.  Gastrointestinal:  Negative for vomiting.  Musculoskeletal:  Negative for back pain.  Skin:  Negative for rash.  Neurological:  Negative for loss of consciousness and headaches.        Objective:   Vitals: BP 124/74   Pulse 68   Temp 98.8 F (37.1 C) (Tympanic)   Ht 5\' 2"  (1.575 m)   Wt 161 lb 12.8 oz (73.4 kg)   SpO2 99%   BMI 29.59 kg/m    Physical Exam Vitals and nursing note reviewed.  Constitutional:      General:  She is not in acute distress.    Appearance: Normal appearance. She is not toxic-appearing.  HENT:     Head: Normocephalic and atraumatic.  Neck:     Thyroid: No thyroid mass, thyromegaly or thyroid tenderness.     Vascular: No carotid bruit.  Cardiovascular:     Rate and Rhythm: Normal rate and regular rhythm. No extrasystoles are present.    Pulses: Normal pulses.     Heart sounds: Normal heart sounds. No murmur heard.    No friction rub. No gallop.  Pulmonary:     Effort: Pulmonary effort is normal. No respiratory distress.     Breath sounds: Normal breath sounds. No wheezing or rales.  Lymphadenopathy:     Cervical: No cervical adenopathy.  Skin:    General: Skin is warm  and dry.  Neurological:     Mental Status: She is alert and oriented to person, place, and time. Mental status is at baseline.  Psychiatric:        Mood and Affect: Mood normal.        Behavior: Behavior normal.        Thought Content: Thought content normal.        Judgment: Judgment normal.       Results:   Studies obtained and personally reviewed by me:   Labs:       Component Value Date/Time   NA 138 02/05/2022 0537   NA 143 07/24/2016 1138   K 3.1 (L) 02/05/2022 0537   CL 108 02/05/2022 0537   CO2 19 (L) 02/05/2022 0537   GLUCOSE 160 (H) 02/05/2022 0537   BUN 29 (H) 02/05/2022 0537   BUN 17 07/24/2016 1138   CREATININE 1.02 (H) 02/05/2022 0537   CREATININE 0.98 11/08/2021 0947   CALCIUM 9.6 02/05/2022 0537   PROT 6.6 05/27/2022 0937   PROT 7.3 07/24/2016 1138   ALBUMIN 4.3 01/17/2021 1428   ALBUMIN 4.7 07/24/2016 1138   AST 18 05/27/2022 0937   ALT 15 05/27/2022 0937   ALKPHOS 42 01/17/2021 1428   BILITOT 0.5 05/27/2022 0937   BILITOT 0.4 07/24/2016 1138   GFRNONAA 59 (L) 02/05/2022 0537   GFRNONAA 59 (L) 11/02/2019 1020   GFRAA 68 11/02/2019 1020     Lab Results  Component Value Date   WBC 6.2 01/23/2022   HGB 12.6 01/23/2022   HCT 37.4 01/23/2022   MCV 95.4 01/23/2022   PLT 229 01/23/2022    Lab Results  Component Value Date   CHOL 161 05/27/2022   HDL 58 05/27/2022   LDLCALC 80 05/27/2022   TRIG 130 05/27/2022   CHOLHDL 2.8 05/27/2022    Lab Results  Component Value Date   HGBA1C 5.8 (H) 05/27/2022     Lab Results  Component Value Date   TSH 4.09 11/08/2021      Assessment & Plan:   Anxiety: refilled alprazolam 0.5 mg twice daily as needed.  Hyperlipidemia: treated with rosuvastatin 20 mg daily. Lipid panel normal on 05/27/22.  Hypokalemia:  Patient concerned about low potassium, 3.1 on 02/05/22. She has been reassured that if she taking supplement, K will be repleted. Taking Klor-Con 20 meq twice daily. K is normal at  4.5  Hypertension: treated with losartan 100 mg daily, chlorthalidone 25 mg daily, metoprolol succinate 50 mg daily after a meal. She takes a potassium supplement. Blood pressure normal today at 124/74.  Impaired glucose tolerance- continue diet and exercise regimen  Return in 6 months for health maintenance exam or  as needed.    I,Alexander Ruley,acting as a Neurosurgeon for Margaree Mackintosh, MD.,have documented all relevant documentation on the behalf of Margaree Mackintosh, MD,as directed by  Margaree Mackintosh, MD while in the presence of Margaree Mackintosh, MD.   I, Margaree Mackintosh, MD, have reviewed all documentation for this visit. The documentation on 06/17/22 for the exam, diagnosis, procedures, and orders are all accurate and complete.

## 2022-05-27 ENCOUNTER — Other Ambulatory Visit: Payer: PPO

## 2022-05-27 DIAGNOSIS — R7302 Impaired glucose tolerance (oral): Secondary | ICD-10-CM | POA: Diagnosis not present

## 2022-05-27 DIAGNOSIS — E7800 Pure hypercholesterolemia, unspecified: Secondary | ICD-10-CM | POA: Diagnosis not present

## 2022-05-28 ENCOUNTER — Encounter: Payer: Self-pay | Admitting: Internal Medicine

## 2022-05-28 ENCOUNTER — Telehealth: Payer: Self-pay

## 2022-05-28 ENCOUNTER — Ambulatory Visit (INDEPENDENT_AMBULATORY_CARE_PROVIDER_SITE_OTHER): Payer: PPO | Admitting: Internal Medicine

## 2022-05-28 VITALS — BP 124/74 | HR 68 | Temp 98.8°F | Ht 62.0 in | Wt 161.8 lb

## 2022-05-28 DIAGNOSIS — E876 Hypokalemia: Secondary | ICD-10-CM | POA: Diagnosis not present

## 2022-05-28 DIAGNOSIS — R7302 Impaired glucose tolerance (oral): Secondary | ICD-10-CM | POA: Diagnosis not present

## 2022-05-28 DIAGNOSIS — F419 Anxiety disorder, unspecified: Secondary | ICD-10-CM

## 2022-05-28 DIAGNOSIS — I10 Essential (primary) hypertension: Secondary | ICD-10-CM

## 2022-05-28 DIAGNOSIS — E7800 Pure hypercholesterolemia, unspecified: Secondary | ICD-10-CM

## 2022-05-28 DIAGNOSIS — Z96653 Presence of artificial knee joint, bilateral: Secondary | ICD-10-CM

## 2022-05-28 DIAGNOSIS — E162 Hypoglycemia, unspecified: Secondary | ICD-10-CM

## 2022-05-28 LAB — LIPID PANEL
Cholesterol: 161 mg/dL (ref <200)
HDL: 58 mg/dL (ref >=50)
LDL Cholesterol (Calc): 80 mg/dL (calc)
Non-HDL Cholesterol (Calc): 103 mg/dL (calc) (ref <130)
Total CHOL/HDL Ratio: 2.8 (calc) (ref <5.0)
Triglycerides: 130 mg/dL (ref <150)

## 2022-05-28 LAB — HEPATIC FUNCTION PANEL
AG Ratio: 1.9 (calc) (ref 1.0–2.5)
ALT: 15 U/L (ref 6–29)
AST: 18 U/L (ref 10–35)
Albumin: 4.3 g/dL (ref 3.6–5.1)
Alkaline phosphatase (APISO): 42 U/L (ref 37–153)
Bilirubin, Direct: 0.1 mg/dL (ref 0.0–0.2)
Globulin: 2.3 g/dL (calc) (ref 1.9–3.7)
Indirect Bilirubin: 0.4 mg/dL (calc) (ref 0.2–1.2)
Total Bilirubin: 0.5 mg/dL (ref 0.2–1.2)
Total Protein: 6.6 g/dL (ref 6.1–8.1)

## 2022-05-28 LAB — COMPLETE METABOLIC PANEL WITH GFR
AG Ratio: 1.9 (calc) (ref 1.0–2.5)
ALT: 15 U/L (ref 6–29)
AST: 18 U/L (ref 10–35)
Albumin: 4.3 g/dL (ref 3.6–5.1)
Alkaline phosphatase (APISO): 42 U/L (ref 37–153)
BUN/Creatinine Ratio: 29 (calc) — ABNORMAL HIGH (ref 6–22)
BUN: 36 mg/dL — ABNORMAL HIGH (ref 7–25)
CO2: 20 mmol/L (ref 20–32)
Calcium: 10 mg/dL (ref 8.6–10.4)
Chloride: 107 mmol/L (ref 98–110)
Creat: 1.26 mg/dL — ABNORMAL HIGH (ref 0.60–1.00)
Globulin: 2.3 g/dL (calc) (ref 1.9–3.7)
Glucose, Bld: 106 mg/dL — ABNORMAL HIGH (ref 65–99)
Potassium: 4.5 mmol/L (ref 3.5–5.3)
Sodium: 140 mmol/L (ref 135–146)
Total Bilirubin: 0.5 mg/dL (ref 0.2–1.2)
Total Protein: 6.6 g/dL (ref 6.1–8.1)
eGFR: 46 mL/min/{1.73_m2} — ABNORMAL LOW (ref >=60)

## 2022-05-28 LAB — TEST AUTHORIZATION

## 2022-05-28 LAB — HEMOGLOBIN A1C
Hgb A1c MFr Bld: 5.8 % of total Hgb — ABNORMAL HIGH (ref <5.7)
Mean Plasma Glucose: 120 mg/dL
eAG (mmol/L): 6.6 mmol/L

## 2022-05-28 MED ORDER — ALPRAZOLAM 0.5 MG PO TABS: 0.5000 mg | ORAL_TABLET | Freq: Two times a day (BID) | ORAL | 1 refills | Status: AC | PRN

## 2022-05-28 NOTE — Telephone Encounter (Signed)
Error

## 2022-06-14 ENCOUNTER — Other Ambulatory Visit: Payer: Self-pay | Admitting: Internal Medicine

## 2022-06-17 NOTE — Patient Instructions (Signed)
Labs are stable. Continue diet and exercise regimen and return in 6 months for healthcare maintenance visit and complete physical exam. No change in meds.

## 2022-07-04 ENCOUNTER — Ambulatory Visit (HOSPITAL_COMMUNITY): Payer: PPO | Attending: Cardiology

## 2022-07-04 DIAGNOSIS — I517 Cardiomegaly: Secondary | ICD-10-CM | POA: Diagnosis not present

## 2022-07-04 LAB — ECHOCARDIOGRAM COMPLETE
Area-P 1/2: 3.53 cm2
S' Lateral: 2 cm

## 2022-08-14 NOTE — Progress Notes (Signed)
  Cardiology Office Note:   Date:  08/15/2022  ID:  Sally Hopkins, DOB 1950-04-14, MRN [id number] PCP: Margaree Mackintosh, MD  [facility] Providers Cardiologist:  Rollene Rotunda, MD {  History of Present Illness:   Sally Hopkins is a 72 y.o. female who is referred by Margaree Mackintosh, MD for evaluation of a possible COVID 19 vaccine reaction.  Dr. Lenord Fellers thought she might have a cardiac involvement.  Echo was unremarkable except for moderate LVH.  She had knee surgery since I saw her.  She is done well with this.  She denies any new cardiovascular symptoms.  She has had no palpitations, presyncope or syncope.  She sometimes walks on it.  She has stairs at home.  She has had no problems with this.=  ROS: As stated in the HPI and negative for all other systems.  Studies Reviewed:    EKG:   NA  Risk Assessment/Calculations:              Physical Exam:   VS:  BP 131/78 (BP Location: Left Arm, Patient Position: Sitting, Cuff Size: Normal)   Pulse 78   Ht 5\' 2"  (1.575 m)   Wt 163 lb 12.8 oz (74.3 kg)   SpO2 97%   BMI 29.96 kg/m    Wt Readings from Last 3 Encounters:  08/15/22 163 lb 12.8 oz (74.3 kg)  05/28/22 161 lb 12.8 oz (73.4 kg)  02/05/22 155 lb (70.3 kg)     GEN: Well nourished, well developed in no acute distress NECK: No JVD; No carotid bruits CARDIAC: RRR, no murmurs, rubs, gallops RESPIRATORY:  Clear to auscultation without rales, wheezing or rhonchi  ABDOMEN: Soft, non-tender, non-distended EXTREMITIES:  No edema; No deformity   ASSESSMENT AND PLAN:    RBBB:   This is chronic.  No change in therapy.  LVH:    This was mild on echo moderate on echo in June 2024.  No further testing is needed.     HTN: Her blood pressure is controlled.  No change in therapy.          Follow up with me as needed.   Signed, Rollene Rotunda, MD

## 2022-08-15 ENCOUNTER — Ambulatory Visit: Payer: PPO | Attending: Cardiology | Admitting: Cardiology

## 2022-08-15 ENCOUNTER — Encounter: Payer: Self-pay | Admitting: Cardiology

## 2022-08-15 VITALS — BP 131/78 | HR 78 | Ht 62.0 in | Wt 163.8 lb

## 2022-08-15 DIAGNOSIS — I10 Essential (primary) hypertension: Secondary | ICD-10-CM

## 2022-08-15 DIAGNOSIS — I4510 Unspecified right bundle-branch block: Secondary | ICD-10-CM

## 2022-08-15 DIAGNOSIS — I517 Cardiomegaly: Secondary | ICD-10-CM

## 2022-08-15 NOTE — Patient Instructions (Signed)
  Follow-Up: At [facility], you and your health needs are our priority.  As part of our continuing mission to provide you with exceptional heart care, we have created designated Provider Care Teams.  These Care Teams include your primary Cardiologist (physician) and Advanced Practice Providers (APPs -  Physician Assistants and Nurse Practitioners) who all work together to provide you with the care you need, when you need it.  We recommend signing up for the patient portal called "MyChart".  Sign up information is provided on this After Visit Summary.  MyChart is used to connect with patients for Virtual Visits (Telemedicine).  Patients are able to view lab/test results, encounter notes, upcoming appointments, etc.  Non-urgent messages can be sent to your provider as well.   To learn more about what you can do with MyChart, go to [email or web address].    Your next appointment:    AS NEEDED   

## 2022-08-16 ENCOUNTER — Encounter: Payer: Self-pay | Admitting: Cardiology

## 2022-09-10 ENCOUNTER — Other Ambulatory Visit: Payer: Self-pay | Admitting: Internal Medicine

## 2022-09-11 ENCOUNTER — Ambulatory Visit: Payer: PPO | Admitting: Cardiology

## 2022-11-26 NOTE — Progress Notes (Signed)
Annual Wellness Visit    Patient Care Team: Anthon Harpole, Luanna Cole, MD as PCP - General (Internal Medicine) Rollene Rotunda, MD as PCP - Cardiology (Cardiology)  Visit Date: 12/03/22   Chief Complaint  Patient presents with   Medicare Wellness   Annual Exam    Subjective:   Patient: Sally Hopkins, Female    DOB: 09/23/1950, 72 y.o.   MRN: [id number]  Sally Hopkins is a 72 y.o. Female who presents today for her Annual Wellness Visit. History of arthritis, melanoma, hyperlipidemia, GERD, HSV-1, hypertension, Meniere's disease, pneumonia.  History of hypertension treated with losartan 100 mg daily, metoprolol succinate 50 mg daily, chlorthalidone 25 mg daily. Blood pressure normal in-office today at 120/70.  History of hyperlipidemia treated with rosuvastatin 20 mg daily. Lipid panel normal.  History of alprazolam 0.5 mg twice daily as needed.  She underwent right knee arthroplasty in January 2023 and left knee arthroplasty in January 2024. She is doing regular PT for left knee pain.   In February 2021, she had a reaction after receiving second COVID-vaccine with fever, chills, myalgias, malaise and fatigue that was protracted.  She saw Dr. Antoine Poche to rule out myocarditis.  He did not feel she had myocarditis.  She had a low-grade temp up to 100.5 degrees.  COVID test was negative as well as flu test.  Echocardiogram was normal and she subsequently recovered without sequela.   She has a history of Mnire's disease which is stable and in remission.  History of Herpes simplex type II.  She takes potassium supplement due to hypokalemia from diuretic therapy.   She has a history of allergic urticaria and allergic rhinitis seen by Dr. Lucie Leather.   She has a history of very mild glucose intolerance with hemoglobin A1c at 5.9% on 11/28/22 and on 05/27/22 it was 5.8%.  This is treated with diet only.   Dr. Antoine Poche performed EKG in January 2023 which showed normal sinus rhythm and right  bundle branch block.  11/28/22 labs reviewed today. Glucose elevated at 115. BUN elevated at 27. Liver functions normal. Electrolytes normal. Blood proteins normal. CBC normal. TSH at 2.37.  Mammogram normal on 12/25/21.  History of colonoscopy 2004. Needs follow-up procedure.   Social history: Married with 2 adult children.  Does not smoke.  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  Social alcohol consumption.   Family history: Father died at age 70 of ALS but also had colon cancer.  Mother with history of open heart surgery 1969 for congenital heart disease and also had CABG.  1 sister in good health.  Past Medical History:  Diagnosis Date   Arthritis    Cancer (HCC)    melanoma   Dyslipidemia    GERD (gastroesophageal reflux disease)    HSV-1 infection    Hypertension    Meniere's disease    Pneumonia      Family History  Problem Relation Age of Onset   ALS Father    Heart disease Mother        Congenital , died post op.       Social History   Social History Narrative   Lives with husband.     Has 2 adult children.  Does not smoke.  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  Social alcohol consumption.  Review of  Systems  Constitutional:  Negative for chills, fever, malaise/fatigue and weight loss.  HENT:  Negative for hearing loss, sinus pain and sore throat.   Respiratory:  Negative for cough, hemoptysis and shortness of breath.   Cardiovascular:  Negative for chest pain, palpitations, leg swelling and PND.  Gastrointestinal:  Negative for abdominal pain, constipation, diarrhea, heartburn, nausea and vomiting.  Genitourinary:  Negative for dysuria, frequency and urgency.  Musculoskeletal:  Positive for joint pain (Left knee). Negative for back pain, myalgias and neck pain.  Skin:  Negative for itching and rash.   Neurological:  Negative for dizziness, tingling, seizures and headaches.  Endo/Heme/Allergies:  Negative for polydipsia.  Psychiatric/Behavioral:  Negative for depression. The patient is not nervous/anxious.       Objective:   Vitals: BP 120/70   Pulse 72   Ht 5\' 2"  (1.575 m)   Wt 161 lb (73 kg)   SpO2 98%   BMI 29.45 kg/m   Physical Exam Vitals and nursing note reviewed.  Constitutional:      General: She is not in acute distress.    Appearance: Normal appearance. She is not ill-appearing or toxic-appearing.  HENT:     Head: Normocephalic and atraumatic.     Right Ear: Hearing, tympanic membrane, ear canal and external ear normal.     Left Ear: Hearing, tympanic membrane, ear canal and external ear normal.     Mouth/Throat:     Pharynx: Oropharynx is clear.  Eyes:     Extraocular Movements: Extraocular movements intact.     Pupils: Pupils are equal, round, and reactive to light.  Neck:     Thyroid: No thyroid mass, thyromegaly or thyroid tenderness.     Vascular: No carotid bruit.  Cardiovascular:     Rate and Rhythm: Normal rate and regular rhythm. No extrasystoles are present.    Pulses:          Dorsalis pedis pulses are 1+ on the right side and 1+ on the left side.       Posterior tibial pulses are 1+ on the right side and 1+ on the left side.     Heart sounds: Normal heart sounds. No murmur heard.    No friction rub. No gallop.  Pulmonary:     Effort: Pulmonary effort is normal.     Breath sounds: Normal breath sounds. No decreased breath sounds, wheezing, rhonchi or rales.  Chest:     Chest wall: No mass.  Abdominal:     Palpations: Abdomen is soft. There is no hepatomegaly, splenomegaly or mass.     Tenderness: There is no abdominal tenderness.     Hernia: No hernia is present.  Musculoskeletal:     Cervical back: Normal range of motion.     Comments: Trace edema bilateral lower legs.  Lymphadenopathy:     Cervical: No cervical adenopathy.     Upper  Body:     Right upper body: No supraclavicular adenopathy.     Left upper body: No supraclavicular adenopathy.  Skin:    General: Skin is warm and dry.  Neurological:     General: No focal deficit present.     Mental Status: She is alert and oriented to person, place, and time. Mental status is at baseline.     Sensory: Sensation is intact.     Motor: Motor function is intact. No weakness.     Deep Tendon Reflexes: Reflexes are normal and symmetric.  Psychiatric:  Attention and Perception: Attention normal.        Mood and Affect: Mood normal.        Speech: Speech normal.        Behavior: Behavior normal.        Thought Content: Thought content normal.        Cognition and Memory: Cognition normal.        Judgment: Judgment normal.      Most recent functional status assessment:    12/03/2022   10:56 AM  In your present state of health, do you have any difficulty performing the following activities:  Hearing? 0  Vision? 0  Difficulty concentrating or making decisions? 0  Walking or climbing stairs? 0  Dressing or bathing? 0  Doing errands, shopping? 0  Preparing Food and eating ? N  Using the Toilet? N  In the past six months, have you accidently leaked urine? N  Do you have problems with loss of bowel control? N  Managing your Medications? N  Managing your Finances? N  Housekeeping or managing your Housekeeping? N   Most recent fall risk assessment:    12/03/2022   11:02 AM  Fall Risk   Falls in the past year? 0  Number falls in past yr: 0  Injury with Fall? 0  Risk for fall due to : No Fall Risks  Follow up Falls prevention discussed;Education provided;Falls evaluation completed    Most recent depression screenings:    12/03/2022   11:03 AM 05/28/2022   11:40 AM  PHQ 2/9 Scores  PHQ - 2 Score 0 0   Most recent cognitive screening:    12/03/2022   11:03 AM  6CIT Screen  What Year? 0 points  What month? 0 points  What time? 0 points  Count  back from 20 0 points  Months in reverse 0 points  Repeat phrase 0 points  Total Score 0 points     Results:   Studies obtained and personally reviewed by me:  Mammogram normal on 12/25/21.  History of colonoscopy 2004. Needs follow-up procedure.   Labs:       Component Value Date/Time   NA 139 11/28/2022 1013   NA 143 07/24/2016 1138   K 4.2 11/28/2022 1013   CL 106 11/28/2022 1013   CO2 24 11/28/2022 1013   GLUCOSE 115 (H) 11/28/2022 1013   BUN 27 (H) 11/28/2022 1013   BUN 17 07/24/2016 1138   CREATININE 1.00 11/28/2022 1013   CALCIUM 9.6 11/28/2022 1013   PROT 6.7 11/28/2022 1013   PROT 7.3 07/24/2016 1138   ALBUMIN 4.3 01/17/2021 1428   ALBUMIN 4.7 07/24/2016 1138   AST 21 11/28/2022 1013   ALT 19 11/28/2022 1013   ALKPHOS 42 01/17/2021 1428   BILITOT 0.6 11/28/2022 1013   BILITOT 0.4 07/24/2016 1138   GFRNONAA 59 (L) 02/05/2022 0537   GFRNONAA 59 (L) 11/02/2019 1020   GFRAA 68 11/02/2019 1020     Lab Results  Component Value Date   WBC 5.0 11/28/2022   HGB 12.6 11/28/2022   HCT 37.1 11/28/2022   MCV 91.4 11/28/2022   PLT 229 11/28/2022    Lab Results  Component Value Date   CHOL 161 11/28/2022   HDL 59 11/28/2022   LDLCALC 78 11/28/2022   TRIG 137 11/28/2022   CHOLHDL 2.7 11/28/2022    Lab Results  Component Value Date   HGBA1C 5.9 (H) 11/28/2022     Lab Results  Component Value Date  TSH 2.37 11/28/2022    Assessment & Plan:   Hypertension: treated with losartan 100 mg daily, metoprolol succinate 50 mg daily, chlorthalidone 25 mg daily. Blood pressure normal in-office today at 120/70.  Hyperlipidemia: treated with rosuvastatin 20 mg daily. Lipid panel normal.  Anxiety: stable with alprazolam 0.5 mg twice daily as needed.  Left knee pain: status post left knee arthroplasty in January 2024. She is doing regular PT and is being careful to limit stress on this knee.  Allergic urticaria and allergic rhinitis: seen by Dr. Lucie Leather.    Mild glucose intolerance: hemoglobin A1c at 5.9% on 11/28/22 and on 05/27/22 it was 5.8%. This is treated with diet only.   Dr. Antoine Poche performed EKG in January 2023 which showed normal sinus rhythm and right bundle branch block.  Breast and pelvic exam deferred to GYN physician.  Urinalysis normal today.  Mammogram normal on 12/25/21.  History of colonoscopy 2004. Referral placed for repeat colonoscopy.  Vaccine counseling: she will go to the pharmacy for tetanus booster. UTD on shingles, high-dose flu vaccines. She will consider RSV vaccine.  Return in 1 year or as needed.     Annual wellness visit done today including the all of the following: Reviewed patient's Family Medical History Reviewed and updated list of patient's medical providers Assessment of cognitive impairment was done Assessed patient's functional ability Established a written schedule for health screening services Health Risk Assessent Completed and Reviewed  Discussed health benefits of physical activity, and encouraged her to engage in regular exercise appropriate for her age and condition.        I,Alexander Ruley,acting as a Neurosurgeon for Margaree Mackintosh, MD.,have documented all relevant documentation on the behalf of Margaree Mackintosh, MD,as directed by  Margaree Mackintosh, MD while in the presence of Margaree Mackintosh, MD.   I, Margaree Mackintosh, MD, have reviewed all documentation for this visit. The documentation on 12/07/22 for the exam, diagnosis, procedures, and orders are all accurate and complete.

## 2022-11-28 ENCOUNTER — Other Ambulatory Visit: Payer: PPO

## 2022-11-28 DIAGNOSIS — F419 Anxiety disorder, unspecified: Secondary | ICD-10-CM

## 2022-11-28 DIAGNOSIS — R7302 Impaired glucose tolerance (oral): Secondary | ICD-10-CM

## 2022-11-28 DIAGNOSIS — E7800 Pure hypercholesterolemia, unspecified: Secondary | ICD-10-CM

## 2022-11-28 DIAGNOSIS — E782 Mixed hyperlipidemia: Secondary | ICD-10-CM

## 2022-11-28 DIAGNOSIS — Z96651 Presence of right artificial knee joint: Secondary | ICD-10-CM

## 2022-11-28 DIAGNOSIS — Z0000 Encounter for general adult medical examination without abnormal findings: Secondary | ICD-10-CM | POA: Diagnosis not present

## 2022-11-28 DIAGNOSIS — H8109 Meniere's disease, unspecified ear: Secondary | ICD-10-CM | POA: Diagnosis not present

## 2022-11-28 DIAGNOSIS — I10 Essential (primary) hypertension: Secondary | ICD-10-CM

## 2022-11-29 ENCOUNTER — Other Ambulatory Visit: Payer: PPO

## 2022-11-29 LAB — COMPLETE METABOLIC PANEL WITH GFR
AG Ratio: 1.8 (calc) (ref 1.0–2.5)
ALT: 19 U/L (ref 6–29)
AST: 21 U/L (ref 10–35)
Albumin: 4.3 g/dL (ref 3.6–5.1)
Alkaline phosphatase (APISO): 45 U/L (ref 37–153)
BUN/Creatinine Ratio: 27 (calc) — ABNORMAL HIGH (ref 6–22)
BUN: 27 mg/dL — ABNORMAL HIGH (ref 7–25)
CO2: 24 mmol/L (ref 20–32)
Calcium: 9.6 mg/dL (ref 8.6–10.4)
Chloride: 106 mmol/L (ref 98–110)
Creat: 1 mg/dL (ref 0.60–1.00)
Globulin: 2.4 g/dL (ref 1.9–3.7)
Glucose, Bld: 115 mg/dL — ABNORMAL HIGH (ref 65–99)
Potassium: 4.2 mmol/L (ref 3.5–5.3)
Sodium: 139 mmol/L (ref 135–146)
Total Bilirubin: 0.6 mg/dL (ref 0.2–1.2)
Total Protein: 6.7 g/dL (ref 6.1–8.1)
eGFR: 60 mL/min/{1.73_m2} (ref >=60)

## 2022-11-29 LAB — CBC WITH DIFFERENTIAL/PLATELET
Absolute Lymphocytes: 1325 {cells}/uL (ref 850–3900)
Absolute Monocytes: 350 {cells}/uL (ref 200–950)
Basophils Absolute: 30 {cells}/uL (ref 0–200)
Basophils Relative: 0.6 %
Eosinophils Absolute: 220 {cells}/uL (ref 15–500)
Eosinophils Relative: 4.4 %
HCT: 37.1 % (ref 35.0–45.0)
Hemoglobin: 12.6 g/dL (ref 11.7–15.5)
MCH: 31 pg (ref 27.0–33.0)
MCHC: 34 g/dL (ref 32.0–36.0)
MCV: 91.4 fL (ref 80.0–100.0)
MPV: 10.7 fL (ref 7.5–12.5)
Monocytes Relative: 7 %
Neutro Abs: 3075 {cells}/uL (ref 1500–7800)
Neutrophils Relative %: 61.5 %
Platelets: 229 10*3/uL (ref 140–400)
RBC: 4.06 10*6/uL (ref 3.80–5.10)
RDW: 12.4 % (ref 11.0–15.0)
Total Lymphocyte: 26.5 %
WBC: 5 10*3/uL (ref 3.8–10.8)

## 2022-11-29 LAB — LIPID PANEL
Cholesterol: 161 mg/dL (ref <200)
HDL: 59 mg/dL (ref >=50)
LDL Cholesterol (Calc): 78 mg/dL
Non-HDL Cholesterol (Calc): 102 mg/dL (ref <130)
Total CHOL/HDL Ratio: 2.7 (calc) (ref <5.0)
Triglycerides: 137 mg/dL (ref <150)

## 2022-11-29 LAB — TSH: TSH: 2.37 m[IU]/L (ref 0.40–4.50)

## 2022-11-29 LAB — HEMOGLOBIN A1C
Hgb A1c MFr Bld: 5.9 %{Hb} — ABNORMAL HIGH (ref <5.7)
Mean Plasma Glucose: 123 mg/dL
eAG (mmol/L): 6.8 mmol/L

## 2022-12-03 ENCOUNTER — Ambulatory Visit: Payer: PPO | Admitting: Internal Medicine

## 2022-12-03 ENCOUNTER — Encounter: Payer: Self-pay | Admitting: Internal Medicine

## 2022-12-03 VITALS — BP 120/70 | HR 72 | Ht 62.0 in | Wt 161.0 lb

## 2022-12-03 DIAGNOSIS — Z96652 Presence of left artificial knee joint: Secondary | ICD-10-CM

## 2022-12-03 DIAGNOSIS — H8109 Meniere's disease, unspecified ear: Secondary | ICD-10-CM | POA: Diagnosis not present

## 2022-12-03 DIAGNOSIS — Z800 Family history of malignant neoplasm of digestive organs: Secondary | ICD-10-CM

## 2022-12-03 DIAGNOSIS — R7302 Impaired glucose tolerance (oral): Secondary | ICD-10-CM | POA: Diagnosis not present

## 2022-12-03 DIAGNOSIS — E7800 Pure hypercholesterolemia, unspecified: Secondary | ICD-10-CM | POA: Diagnosis not present

## 2022-12-03 DIAGNOSIS — I10 Essential (primary) hypertension: Secondary | ICD-10-CM | POA: Diagnosis not present

## 2022-12-03 DIAGNOSIS — F419 Anxiety disorder, unspecified: Secondary | ICD-10-CM

## 2022-12-03 DIAGNOSIS — Z0000 Encounter for general adult medical examination without abnormal findings: Secondary | ICD-10-CM

## 2022-12-03 DIAGNOSIS — Z96651 Presence of right artificial knee joint: Secondary | ICD-10-CM | POA: Diagnosis not present

## 2022-12-03 LAB — POCT URINALYSIS DIP (CLINITEK)
Bilirubin, UA: NEGATIVE
Blood, UA: NEGATIVE
Glucose, UA: NEGATIVE mg/dL
Ketones, POC UA: NEGATIVE mg/dL
Leukocytes, UA: NEGATIVE
Nitrite, UA: NEGATIVE
POC PROTEIN,UA: NEGATIVE
Spec Grav, UA: 1.015 (ref 1.010–1.025)
Urobilinogen, UA: 0.2 U/dL
pH, UA: 6 (ref 5.0–8.0)

## 2022-12-07 NOTE — Patient Instructions (Addendum)
It was a pleasure to see you today.  Labs are stable.  Blood pressure is stable.  Lipid panel is under good control with rosuvastatin.  May continue with alprazolam twice daily as needed for anxiety.  Continue to watch diet for mild glucose intolerance which is stable at 5.9%.  Please see GYN physician for GYN exam.  Referral placed for colonoscopy.  May return in 1 year or as needed.

## 2022-12-11 ENCOUNTER — Other Ambulatory Visit: Payer: Self-pay | Admitting: Internal Medicine

## 2022-12-18 ENCOUNTER — Other Ambulatory Visit: Payer: Self-pay | Admitting: Internal Medicine

## 2023-01-13 ENCOUNTER — Ambulatory Visit (INDEPENDENT_AMBULATORY_CARE_PROVIDER_SITE_OTHER): Payer: PPO | Admitting: Internal Medicine

## 2023-01-13 VITALS — BP 110/70 | HR 75 | Temp 97.8°F | Ht 62.0 in | Wt 161.0 lb

## 2023-01-13 DIAGNOSIS — J22 Unspecified acute lower respiratory infection: Secondary | ICD-10-CM | POA: Diagnosis not present

## 2023-01-13 DIAGNOSIS — H6503 Acute serous otitis media, bilateral: Secondary | ICD-10-CM | POA: Diagnosis not present

## 2023-01-13 MED ORDER — AZITHROMYCIN 250 MG PO TABS: ORAL_TABLET | ORAL | 0 refills | Status: AC

## 2023-01-13 MED ORDER — HYDROCODONE BIT-HOMATROP MBR 5-1.5 MG/5ML PO SOLN: 5.0000 mL | Freq: Three times a day (TID) | ORAL | 0 refills | Status: DC | PRN

## 2023-01-13 MED ORDER — FLUCONAZOLE 150 MG PO TABS
150.0000 mg | ORAL_TABLET | Freq: Once | ORAL | 0 refills | Status: AC
Start: 2023-01-13 — End: 2023-01-13

## 2023-01-13 NOTE — Progress Notes (Signed)
 Patient Care Team: Perri Sally PARAS, MD as PCP - General (Internal Medicine) Lavona Agent, MD as PCP - Cardiology (Cardiology)  Visit Date: 01/13/23  Subjective:   Chief Complaint  Patient presents with   Cough   Nasal Congestion   Patient PI:Sally Hopkins,Female DOB:1950/07/06,73 y.o.MRN:[id number]   73 y.o. Female presents today for acute sick visit with cough and nasal congestion. Patient has a past medical history of PNA, HTN, and Dyslipidemia. At-home Covid test NEG. Endorses symptoms starting 1/1 with cough, malaise, sinus/nasal/chest congestion, yellow-green mucus production, and scalp tenderness. Has had sick contact with grandson. UTD of Flu vaccine. Denies N/V/D.  Past Medical History:  Diagnosis Date   Arthritis    Cancer (HCC)    melanoma   Dyslipidemia    GERD (gastroesophageal reflux disease)    HSV-1 infection    Hypertension    Meniere's disease    Pneumonia     Family History  Problem Relation Age of Onset   ALS Father    Heart disease Mother        Congenital , died post op.     Social History   Social History Narrative   Lives with husband.     Review of Systems  Constitutional:  Positive for malaise/fatigue. Negative for fever.  HENT:  Positive for congestion (sinus, nasal, chest).        (+) Scalp Tenderness   Eyes:  Negative for blurred vision.  Respiratory:  Positive for cough and sputum production (yellow-green). Negative for shortness of breath.   Cardiovascular:  Negative for chest pain, palpitations and leg swelling.  Gastrointestinal:  Negative for vomiting.  Musculoskeletal:  Negative for back pain.  Skin:  Negative for rash.  Neurological:  Negative for loss of consciousness and headaches.     Objective:  Vitals: BP 110/70   Pulse 75   Temp 97.8 F (36.6 C)   Ht 5' 2 (1.575 m)   Wt 161 lb (73 kg)   SpO2 97%   BMI 29.45 kg/m  Physical Exam Vitals and nursing note reviewed.  Constitutional:      General: She is not  in acute distress.    Appearance: Normal appearance. She is not toxic-appearing.  HENT:     Head: Normocephalic and atraumatic.     Comments: Anterior cervical nodes swollen bilaterally    Ears:     Comments: Left Ear: fluid, slightly full, not red Right Ear: fluid (more than left ear), pale, splayed light reflex    Mouth/Throat:     Comments: Throat: Slightly injected Pulmonary:     Effort: Pulmonary effort is normal.  Skin:    General: Skin is warm and dry.  Neurological:     Mental Status: She is alert and oriented to person, place, and time. Mental status is at baseline.  Psychiatric:        Mood and Affect: Mood normal.        Behavior: Behavior normal.        Thought Content: Thought content normal.        Judgment: Judgment normal.     Results:  Studies Obtained And Personally Reviewed By Me: Labs:     Component Value Date/Time   NA 139 11/28/2022 1013   NA 143 07/24/2016 1138   K 4.2 11/28/2022 1013   CL 106 11/28/2022 1013   CO2 24 11/28/2022 1013   GLUCOSE 115 (H) 11/28/2022 1013   BUN 27 (H) 11/28/2022 1013   BUN 17 07/24/2016  1138   CREATININE 1.00 11/28/2022 1013   CALCIUM  9.6 11/28/2022 1013   PROT 6.7 11/28/2022 1013   PROT 7.3 07/24/2016 1138   ALBUMIN 4.3 01/17/2021 1428   ALBUMIN 4.7 07/24/2016 1138   AST 21 11/28/2022 1013   ALT 19 11/28/2022 1013   ALKPHOS 42 01/17/2021 1428   BILITOT 0.6 11/28/2022 1013   BILITOT 0.4 07/24/2016 1138   GFRNONAA 59 (L) 02/05/2022 0537   GFRNONAA 59 (L) 11/02/2019 1020   GFRAA 68 11/02/2019 1020    Lab Results  Component Value Date   WBC 5.0 11/28/2022   HGB 12.6 11/28/2022   HCT 37.1 11/28/2022   MCV 91.4 11/28/2022   PLT 229 11/28/2022   Lab Results  Component Value Date   CHOL 161 11/28/2022   HDL 59 11/28/2022   LDLCALC 78 11/28/2022   TRIG 137 11/28/2022   CHOLHDL 2.7 11/28/2022   Lab Results  Component Value Date   HGBA1C 5.9 (H) 11/28/2022    Lab Results  Component Value Date   TSH 2.37  11/28/2022   Assessment & Plan:  Acute Bilateral Serous Otitis Media; Acute Lower Respiratory Infection: UTD of Flu vaccine. At-home Covid test NEG. Sending in 250 mg Azithromycin  - take 2 tablets on Day 1 and 1 tablet on Days 2-5, Hycodan syrup for cough - take 1 teaspoon every 8 hours as needed, and 150 mg Diflucan  in case you develop a yeast infection. Stay well rested, well hydrated, and well nourished. Contact us  if symptoms worsen/persist despite treatment!   I,Emily Lagle,acting as a neurosurgeon for Sally JINNY Hailstone, MD.,have documented all relevant documentation on the behalf of Sally JINNY Hailstone, MD,as directed by  Sally JINNY Hailstone, MD while in the presence of Sally JINNY Hailstone, MD.   I, Sally JINNY Hailstone, MD, have reviewed all documentation for this visit. The documentation on 01/20/23 for the exam, diagnosis, procedures, and orders are all accurate and complete.

## 2023-01-20 NOTE — Patient Instructions (Signed)
 You have been diagnosed with  acute bilateral serous otitis media and an acute lower respiratory infection.  We are sending in Zithromax  Z-PAK to take 2 tabs day 1 followed by 1 tab days 2 through 5.  Also sending in Hycodan cough syrup 1 teaspoon every 8 hours as needed for cough and Diflucan  150 mg tablet in case you develop a yeast infection while on antibiotics.  Rest, stay well-hydrated.  Contact us  if symptoms worsen or fail to improve within a few days.

## 2023-01-31 DIAGNOSIS — Z96651 Presence of right artificial knee joint: Secondary | ICD-10-CM | POA: Diagnosis not present

## 2023-01-31 DIAGNOSIS — Z96652 Presence of left artificial knee joint: Secondary | ICD-10-CM | POA: Diagnosis not present

## 2023-02-03 DIAGNOSIS — Z85820 Personal history of malignant melanoma of skin: Secondary | ICD-10-CM | POA: Diagnosis not present

## 2023-02-03 DIAGNOSIS — Z85828 Personal history of other malignant neoplasm of skin: Secondary | ICD-10-CM | POA: Diagnosis not present

## 2023-02-03 DIAGNOSIS — D485 Neoplasm of uncertain behavior of skin: Secondary | ICD-10-CM | POA: Diagnosis not present

## 2023-02-03 DIAGNOSIS — D225 Melanocytic nevi of trunk: Secondary | ICD-10-CM | POA: Diagnosis not present

## 2023-02-03 DIAGNOSIS — D2261 Melanocytic nevi of right upper limb, including shoulder: Secondary | ICD-10-CM | POA: Diagnosis not present

## 2023-02-03 DIAGNOSIS — L821 Other seborrheic keratosis: Secondary | ICD-10-CM | POA: Diagnosis not present

## 2023-02-03 DIAGNOSIS — L570 Actinic keratosis: Secondary | ICD-10-CM | POA: Diagnosis not present

## 2023-02-05 DIAGNOSIS — Z1231 Encounter for screening mammogram for malignant neoplasm of breast: Secondary | ICD-10-CM | POA: Diagnosis not present

## 2023-02-05 LAB — HM MAMMOGRAPHY

## 2023-02-06 ENCOUNTER — Encounter: Payer: Self-pay | Admitting: Internal Medicine

## 2023-03-17 ENCOUNTER — Encounter: Payer: Self-pay | Admitting: Internal Medicine

## 2023-03-18 ENCOUNTER — Ambulatory Visit (INDEPENDENT_AMBULATORY_CARE_PROVIDER_SITE_OTHER): Admitting: Internal Medicine

## 2023-03-18 ENCOUNTER — Other Ambulatory Visit: Payer: Self-pay | Admitting: Internal Medicine

## 2023-03-18 ENCOUNTER — Encounter: Payer: Self-pay | Admitting: Internal Medicine

## 2023-03-18 VITALS — BP 130/62 | HR 64 | Temp 98.8°F | Ht 62.0 in | Wt 161.0 lb

## 2023-03-18 DIAGNOSIS — Z8619 Personal history of other infectious and parasitic diseases: Secondary | ICD-10-CM

## 2023-03-18 DIAGNOSIS — J22 Unspecified acute lower respiratory infection: Secondary | ICD-10-CM

## 2023-03-18 MED ORDER — LEVOFLOXACIN 500 MG PO TABS: 500.0000 mg | ORAL_TABLET | Freq: Every day | ORAL | 0 refills | Status: AC

## 2023-03-18 MED ORDER — VALACYCLOVIR HCL 500 MG PO TABS: 500.0000 mg | ORAL_TABLET | Freq: Two times a day (BID) | ORAL | 0 refills | Status: AC

## 2023-03-18 NOTE — Progress Notes (Signed)
 Patient Care Team: Margaree Mackintosh, MD as PCP - General (Internal Medicine) Rollene Rotunda, MD as PCP - Cardiology (Cardiology)  Visit Date: 03/18/23  Subjective:   Chief Complaint  Patient presents with   Cough  Patient ZO:XWRUE D Mcmanamon,Female DOB:Dec 30, 1950,72 y.o. [id number]:[id number]   73 y.o. Female presents today for acute sick visit with Cough. Patient has a past medical history of PNA, HTN, and Dyslipidemia; last seen 01/13/2023 for similar symptoms. Says her symptoms began at the end of last week, initially starting off with a sore throat that worsened into sinus/nasal/ear congestion w/ yellow drainage, cough that was dry but is now productive w/ yellow sputum.   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  Past Medical History:  Diagnosis Date   Arthritis    Cancer (HCC)    melanoma   Dyslipidemia    GERD (gastroesophageal reflux disease)    HSV-1 infection    Hypertension    Meniere's disease    Pneumonia     Allergies  Allergen Reactions   Erythromycin     Unknown reaction   Macrobid  [Nitrofurantoin Macrocrystal]     Unknown reaction   Sulfa Antibiotics    Penicillins Rash    Tolerated ancef well.    Thimerosol [Thimerosal (Thiomersal)] Rash    Family History  Problem Relation Age of Onset   ALS Father    Heart disease Mother        Congenital , died post op.     Social History   Social History Narrative   Lives with husband.     Review of Systems  Constitutional:  Positive for malaise/fatigue.  HENT:  Positive for congestion (sinus/nasal/chest/ear w/yellow mucus drainage), ear pain (when blowing nose) and sore throat (intial).   Respiratory:  Positive for cough and sputum production (yellow).   Neurological:  Positive for headaches (attributed to sinus/nasal congestion).     Objective:  Vitals: BP 130/62   Pulse 64   Temp 98.8 F (37.1 C)   Ht 5\' 2"  (1.575 m)    Wt 161 lb (73 kg)   SpO2 97%   BMI 29.45 kg/m   Physical Exam Vitals and nursing note reviewed.  Constitutional:      General: She is not in acute distress.    Appearance: Normal appearance. She is not ill-appearing.  HENT:     Head: Normocephalic and atraumatic.     Right Ear: Ear canal and external ear normal. Tympanic membrane is not erythematous.     Left Ear: Ear canal and external ear normal. Tympanic membrane is not erythematous.     Ears:     Comments: Ears Bilaterally full, not red.     Mouth/Throat:     Mouth: Mucous membranes are moist.     Pharynx: Posterior oropharyngeal erythema (slight) present. No oropharyngeal exudate.  Pulmonary:     Effort: Pulmonary effort is normal.     Breath sounds: Normal breath sounds. No wheezing, rhonchi or rales.  Lymphadenopathy:     Cervical: No cervical adenopathy.  Skin:    General: Skin is warm and dry.  Neurological:     Mental Status: She is alert and oriented to person, place, and time. Mental status is at baseline.  Psychiatric:        Mood and Affect: Mood normal.        Behavior: Behavior normal.  Thought Content: Thought content normal.        Judgment: Judgment normal.     Results:  Studies Obtained And Personally Reviewed By Me: Labs:     Component Value Date/Time   NA 139 11/28/2022 1013   NA 143 07/24/2016 1138   K 4.2 11/28/2022 1013   CL 106 11/28/2022 1013   CO2 24 11/28/2022 1013   GLUCOSE 115 (H) 11/28/2022 1013   BUN 27 (H) 11/28/2022 1013   BUN 17 07/24/2016 1138   CREATININE 1.00 11/28/2022 1013   CALCIUM 9.6 11/28/2022 1013   PROT 6.7 11/28/2022 1013   PROT 7.3 07/24/2016 1138   ALBUMIN 4.3 01/17/2021 1428   ALBUMIN 4.7 07/24/2016 1138   AST 21 11/28/2022 1013   ALT 19 11/28/2022 1013   ALKPHOS 42 01/17/2021 1428   BILITOT 0.6 11/28/2022 1013   BILITOT 0.4 07/24/2016 1138   GFRNONAA 59 (L) 02/05/2022 0537   GFRNONAA 59 (L) 11/02/2019 1020   GFRAA 68 11/02/2019 1020    Lab  Results  Component Value Date   WBC 5.0 11/28/2022   HGB 12.6 11/28/2022   HCT 37.1 11/28/2022   MCV 91.4 11/28/2022   PLT 229 11/28/2022   Lab Results  Component Value Date   CHOL 161 11/28/2022   HDL 59 11/28/2022   LDLCALC 78 11/28/2022   TRIG 137 11/28/2022   CHOLHDL 2.7 11/28/2022   Lab Results  Component Value Date   HGBA1C 5.9 (H) 11/28/2022    Lab Results  Component Value Date   TSH 2.37 11/28/2022   Assessment & Plan:   Acute Lower Respiratory Infection: sending in 500 mg Levaquin - take 1 tablet daily with a meal for 7 days. Stay well rested, well hydrated, and well nourished. Walk around some to prevent atelectasis. Contact us if symptoms worsen/persist despite treatment.   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     I,Emily Lagle,acting as a Neurosurgeon for Margaree Mackintosh, MD.,have documented all relevant documentation on the behalf of Margaree Mackintosh, MD,as directed by  Margaree Mackintosh, MD while in the presence of Margaree Mackintosh, MD.   ***

## 2023-03-19 ENCOUNTER — Encounter: Payer: Self-pay | Admitting: Internal Medicine

## 2023-03-19 NOTE — Patient Instructions (Addendum)
 You been diagnosed with an acute lower respiratory infection.  Please take Levaquin 500 milligrams daily for 7 days.  Please rest, stay well-hydrated and walk some to prevent atelectasis.  We have noted in your chart that you are taking Valtrex as needed rather than daily for HSV type II infection

## 2023-06-07 ENCOUNTER — Other Ambulatory Visit: Payer: Self-pay | Admitting: Internal Medicine

## 2023-07-24 ENCOUNTER — Encounter: Payer: Self-pay | Admitting: Internal Medicine

## 2023-07-24 ENCOUNTER — Ambulatory Visit (INDEPENDENT_AMBULATORY_CARE_PROVIDER_SITE_OTHER): Admitting: Internal Medicine

## 2023-07-24 VITALS — BP 120/70 | HR 68 | Temp 98.5°F | Ht 62.0 in | Wt 161.0 lb

## 2023-07-24 DIAGNOSIS — K1121 Acute sialoadenitis: Secondary | ICD-10-CM

## 2023-07-24 MED ORDER — DOXYCYCLINE HYCLATE 100 MG PO TABS: 100.0000 mg | ORAL_TABLET | Freq: Two times a day (BID) | ORAL | 0 refills | Status: DC

## 2023-07-24 NOTE — Progress Notes (Signed)
 Patient Care Team: Perri Ronal PARAS, MD as PCP - General (Internal Medicine) Lavona Agent, MD as PCP - Cardiology (Cardiology)  Visit Date: 07/24/23  Subjective:   Chief Complaint  Patient presents with   Neck swelling    Patient noticed a swollen knot in her neck on her left side it is painful to touch.    Patient PI:Sally Hopkins,Female DOB:October 20, 1950,S[age] y.o. Sally Hopkins   S[age] y.o.Female presents today for acute sick visit with Neck Swelling. Patient has a past medical history of Allergic Rhinitis; HTN; HLD. Says that she noticed a nodule on the left-side of her neck below her ear, which is tender to touch. Denies fever, sore throat, or changes in her eating habits.   Past Medical History:  Diagnosis Date   Arthritis    Cancer (HCC)    melanoma   Dyslipidemia    GERD (gastroesophageal reflux disease)    HSV-1 infection    Hypertension    Meniere's disease    Pneumonia     Allergies  Allergen Reactions   Erythromycin     Unknown reaction   Macrobid  [Nitrofurantoin Macrocrystal]     Unknown reaction   Sulfa Antibiotics    Penicillins Rash    Tolerated ancef  well.    Thimerosol [Thimerosal (Thiomersal)] Rash    Family History  Problem Relation Age of Onset   ALS Father    Heart disease Mother        Congenital , died post op.     Social History   Social History Narrative   Lives with husband.     Review of Systems  Skin:        (+) Left-sided Neck Nodule     Objective:  Vitals: BP 120/70   Pulse 68   Temp 98.5 Hopkins (36.9 C)   Ht 5' 2 (1.575 m)   Wt 161 lb (73 kg)   SpO2 97%   BMI 29.45 kg/m   Physical Exam Vitals and nursing note reviewed.  Constitutional:      General: She is not in acute distress.    Appearance: Normal appearance. She is not toxic-appearing.  HENT:     Head: Normocephalic and atraumatic.  Neck:     Comments: Tenderness on palpation to left-mandible Pulmonary:     Effort: Pulmonary effort is normal.  Skin:     General: Skin is warm and dry.  Neurological:     Mental Status: She is alert and oriented to person, place, and time. Mental status is at baseline.  Psychiatric:        Mood and Affect: Mood normal.        Behavior: Behavior normal.        Thought Content: Thought content normal.        Judgment: Judgment normal.     Results:  Studies Obtained And Personally Reviewed By Me: Labs:     Component Value Date/Time   NA 139 11/28/2022 1013   NA 143 07/24/2016 1138   K 4.2 11/28/2022 1013   CL 106 11/28/2022 1013   CO2 24 11/28/2022 1013   GLUCOSE 115 (H) 11/28/2022 1013   BUN 27 (H) 11/28/2022 1013   BUN 17 07/24/2016 1138   CREATININE 1.00 11/28/2022 1013   CALCIUM  9.6 11/28/2022 1013   PROT 6.7 11/28/2022 1013   PROT 7.3 07/24/2016 1138   ALBUMIN 4.3 01/17/2021 1428   ALBUMIN 4.7 07/24/2016 1138   AST 21 11/28/2022 1013   ALT 19 11/28/2022 1013  ALKPHOS 42 01/17/2021 1428   BILITOT 0.6 11/28/2022 1013   BILITOT 0.4 07/24/2016 1138   GFRNONAA 59 (L) 02/05/2022 0537   GFRNONAA 59 (L) 11/02/2019 1020   GFRAA 68 11/02/2019 1020    Lab Results  Component Value Date   WBC 5.0 11/28/2022   HGB 12.6 11/28/2022   HCT 37.1 11/28/2022   MCV 91.4 11/28/2022   PLT 229 11/28/2022   Lab Results  Component Value Date   CHOL 161 11/28/2022   HDL 59 11/28/2022   LDLCALC 78 11/28/2022   TRIG 137 11/28/2022   CHOLHDL 2.7 11/28/2022   Lab Results  Component Value Date   HGBA1C 5.9 (H) 11/28/2022    Lab Results  Component Value Date   TSH 2.37 11/28/2022     No results found for any visits on 07/24/23. Assessment & Plan:   Parotitis - No dental issues she says. No recent Respiratory infection, sore throat. No one at home is ill. Has not tested for Covid-19. Pharynx is not red.   Plan is to try course of Doxycycline  100 mg twice daily for 7 days and see if symptoms resolve. If symptoms recur, will refer to ENT        I,Emily Lagle,acting as a scribe for Ronal JINNY Hailstone, MD.,have documented all relevant documentation on the behalf of Ronal JINNY Hailstone, MD,as directed by  Ronal JINNY Hailstone, MD while in the presence of Ronal JINNY Hailstone, MD.   I, Ronal JINNY Hailstone, MD, have reviewed all documentation for this visit. The documentation on 07/28/23 for the exam, diagnosis, procedures, and orders are all accurate and complete.

## 2023-07-28 ENCOUNTER — Encounter: Payer: Self-pay | Admitting: Internal Medicine

## 2023-07-28 NOTE — Patient Instructions (Addendum)
 I believe you have an inflamed salivary gland.I have prescribed Doxycycline  100 mg twice daily for 7 days. Please call back if symptoms do not completely resolve in 7- 10 days.

## 2023-09-07 ENCOUNTER — Other Ambulatory Visit: Payer: Self-pay | Admitting: Internal Medicine

## 2023-11-20 ENCOUNTER — Telehealth: Payer: Self-pay | Admitting: Internal Medicine

## 2023-12-06 ENCOUNTER — Other Ambulatory Visit: Payer: Self-pay | Admitting: Internal Medicine

## 2023-12-15 ENCOUNTER — Other Ambulatory Visit: Payer: PPO

## 2023-12-16 ENCOUNTER — Ambulatory Visit: Payer: PPO | Admitting: Internal Medicine

## 2023-12-22 ENCOUNTER — Other Ambulatory Visit: Payer: Self-pay

## 2023-12-22 DIAGNOSIS — H8109 Meniere's disease, unspecified ear: Secondary | ICD-10-CM

## 2023-12-22 DIAGNOSIS — E7800 Pure hypercholesterolemia, unspecified: Secondary | ICD-10-CM

## 2023-12-22 DIAGNOSIS — I10 Essential (primary) hypertension: Secondary | ICD-10-CM

## 2023-12-22 DIAGNOSIS — R7302 Impaired glucose tolerance (oral): Secondary | ICD-10-CM

## 2023-12-22 DIAGNOSIS — Z0000 Encounter for general adult medical examination without abnormal findings: Secondary | ICD-10-CM

## 2023-12-22 NOTE — Telephone Encounter (Signed)
 Done

## 2023-12-23 ENCOUNTER — Ambulatory Visit: Payer: Self-pay | Admitting: Internal Medicine

## 2023-12-23 LAB — COMPREHENSIVE METABOLIC PANEL WITH GFR
AG Ratio: 1.7 (calc) (ref 1.0–2.5)
ALT: 18 U/L (ref 6–29)
AST: 20 U/L (ref 10–35)
Albumin: 4.5 g/dL (ref 3.6–5.1)
Alkaline phosphatase (APISO): 45 U/L (ref 37–153)
BUN/Creatinine Ratio: 27 (calc) — ABNORMAL HIGH (ref 6–22)
BUN: 33 mg/dL — ABNORMAL HIGH (ref 7–25)
CO2: 25 mmol/L (ref 20–32)
Calcium: 10.3 mg/dL (ref 8.6–10.4)
Chloride: 107 mmol/L (ref 98–110)
Creat: 1.22 mg/dL — ABNORMAL HIGH (ref 0.60–1.00)
Globulin: 2.6 g/dL (ref 1.9–3.7)
Glucose, Bld: 127 mg/dL — ABNORMAL HIGH (ref 65–99)
Potassium: 4.5 mmol/L (ref 3.5–5.3)
Sodium: 141 mmol/L (ref 135–146)
Total Bilirubin: 0.6 mg/dL (ref 0.2–1.2)
Total Protein: 7.1 g/dL (ref 6.1–8.1)
eGFR: 47 mL/min/1.73m2 — ABNORMAL LOW (ref >=60)

## 2023-12-23 LAB — CBC WITH DIFFERENTIAL/PLATELET
Absolute Lymphocytes: 1595 {cells}/uL (ref 850–3900)
Absolute Monocytes: 429 {cells}/uL (ref 200–950)
Basophils Absolute: 42 {cells}/uL (ref 0–200)
Basophils Relative: 0.8 %
Eosinophils Absolute: 440 {cells}/uL (ref 15–500)
Eosinophils Relative: 8.3 %
HCT: 38.3 % (ref 35.9–46.0)
Hemoglobin: 12.5 g/dL (ref 11.7–15.5)
MCH: 30.9 pg (ref 27.0–33.0)
MCHC: 32.6 g/dL (ref 31.6–35.4)
MCV: 94.6 fL (ref 81.4–101.7)
MPV: 11 fL (ref 7.5–12.5)
Monocytes Relative: 8.1 %
Neutro Abs: 2793 {cells}/uL (ref 1500–7800)
Neutrophils Relative %: 52.7 %
Platelets: 232 Thousand/uL (ref 140–400)
RBC: 4.05 Million/uL (ref 3.80–5.10)
RDW: 13.3 % (ref 11.0–15.0)
Total Lymphocyte: 30.1 %
WBC: 5.3 Thousand/uL (ref 3.8–10.8)

## 2023-12-23 LAB — HEMOGLOBIN A1C
Hgb A1c MFr Bld: 5.8 % — ABNORMAL HIGH (ref <5.7)
Mean Plasma Glucose: 120 mg/dL
eAG (mmol/L): 6.6 mmol/L

## 2023-12-23 LAB — LIPID PANEL
Cholesterol: 161 mg/dL (ref <200)
HDL: 64 mg/dL (ref >=50)
LDL Cholesterol (Calc): 77 mg/dL
Non-HDL Cholesterol (Calc): 97 mg/dL (ref <130)
Total CHOL/HDL Ratio: 2.5 (calc) (ref <5.0)
Triglycerides: 118 mg/dL (ref <150)

## 2023-12-23 LAB — TSH: TSH: 3.59 m[IU]/L (ref 0.40–4.50)

## 2024-01-05 NOTE — Progress Notes (Signed)
 "  Annual Wellness Visit   Patient Care Team: Sheva Mcdougle, Ronal PARAS, MD as PCP - General (Internal Medicine) Lavona Agent, MD as PCP - Cardiology (Cardiology)  Visit Date: 01/16/2024   Chief Complaint  Patient presents with   Annual Exam   Medicare Wellness   Subjective:  Patient: Sally Hopkins, Female DOB: 08-Sep-1950, 73 y.o. MRN: [id number] Vitals:   01/16/24 1058  BP: 130/70   Sally Hopkins is a 73 y.o. Female who presents today for her Annual Wellness Visit. Patient has Hypertension; Meniere's disease; Hyperlipidemia; Herpes simplex type 2 infection; Allergic rhinitis; Abnormal Papanicolaou smear of cervix with positive human papilloma virus (HPV) test; Allergic urticaria; Pain in right knee; Osteoarthritis of knee; HPV in female; Precordial chest pain; LVH (left ventricular hypertrophy); S/P total knee arthroplasty, right; and S/P total knee arthroplasty, left on their problem list.  History of hypertension treated with losartan  100 mg daily, metoprolol  succinate 50 mg daily, chlorthalidone  25 mg daily. Blood pressure is normal at 130/70.    History of hyperlipidemia treated with rosuvastatin  20 mg daily. 12/22/2023 lipid panel normal.    History of anxiety treated with alprazolam  0.5 mg twice daily as needed.  She has had Covid a total of 3 times, the last time being October of 2025.    She underwent right knee arthroplasty in January 2023 and left knee arthroplasty in January 2024. She is doing regular PT for left knee pain.   In February 2021, she had a reaction after receiving second COVID-vaccine with fever, chills, myalgias, malaise and fatigue that was protracted.  She saw Dr. Lavona to rule out myocarditis.  He did not feel she had myocarditis.  She had a low-grade temp up to 100.5 degrees.  COVID test was negative as well as flu test.  Echocardiogram was normal and she subsequently recovered without sequela.   She has a history of Mnire's disease which is stable and  in remission.  History of Herpes simplex type II.  She takes potassium supplement due to hypokalemia from diuretic therapy.   She has a history of allergic urticaria and allergic rhinitis seen by Dr. Kozlow.   She has a history of very mild glucose intolerance, 12/22/2023 HgbA1c 5.8%.      Dr. Lavona performed EKG in January 2023 which showed normal sinus rhythm and right bundle branch block.   History of colonoscopy 2004. Needs follow-up procedure.    Labs 12/22/2023 BUN 33, Creatinine 1.22, eGFR 47, Bun/creatinine ratio 27, HgbA1c 5.8, Otherwise WNL.   02/05/2023 mammogram No mammographic evidence of malignancy. Repeat in one year.    Health Maintenance: Mammogram scheduled for February. Colonoscopy due.    Health Maintenance  Topic Date Due   Colonoscopy  08/11/2012   COVID-19 Vaccine (5 - 2025-26 season) 09/08/2023   Medicare Annual Wellness (AWV)  12/03/2023   Mammogram  02/05/2024   DTaP/Tdap/Td (4 - Td or Tdap) 07/07/2033   Pneumococcal Vaccine: 50+ Years  Completed   Influenza Vaccine  Completed   Bone Density Scan  Completed   Zoster Vaccines- Shingrix  Completed   Meningococcal B Vaccine  Aged Out   Hepatitis C Screening  Discontinued    Review of Systems  Constitutional:  Negative for fever and malaise/fatigue.  HENT:  Negative for congestion.   Eyes:  Negative for blurred vision.  Respiratory:  Negative for cough and shortness of breath.   Cardiovascular:  Negative for chest pain, palpitations and leg swelling.  Gastrointestinal:  Negative for vomiting.  Musculoskeletal:  Negative for back pain.  Skin:  Negative for rash.  Neurological:  Negative for loss of consciousness and headaches.   Objective:  Vitals: body mass index is 30.67 kg/m. Today's Vitals   01/16/24 1058  BP: 130/70  Pulse: 71  SpO2: 97%  Weight: 165 lb (74.8 kg)  Height: 5' 1.5 (1.562 m)  PainSc: 0-No pain   Physical Exam Vitals and nursing note reviewed.  Constitutional:       General: She is not in acute distress.    Appearance: Normal appearance. She is not ill-appearing or toxic-appearing.  HENT:     Head: Normocephalic and atraumatic.     Right Ear: Hearing, tympanic membrane, ear canal and external ear normal.     Left Ear: Hearing, tympanic membrane, ear canal and external ear normal.     Mouth/Throat:     Pharynx: Oropharynx is clear.  Eyes:     Extraocular Movements: Extraocular movements intact.     Pupils: Pupils are equal, round, and reactive to light.  Neck:     Thyroid : No thyroid  mass, thyromegaly or thyroid  tenderness.     Vascular: No carotid bruit.  Cardiovascular:     Rate and Rhythm: Normal rate and regular rhythm. No extrasystoles are present.    Pulses:          Dorsalis pedis pulses are 2+ on the right side and 2+ on the left side.     Heart sounds: Normal heart sounds. No murmur heard.    No friction rub. No gallop.  Pulmonary:     Effort: Pulmonary effort is normal.     Breath sounds: Normal breath sounds. No decreased breath sounds, wheezing, rhonchi or rales.  Chest:     Chest wall: No mass.  Abdominal:     Palpations: Abdomen is soft. There is no hepatomegaly, splenomegaly or mass.     Tenderness: There is no abdominal tenderness.     Hernia: No hernia is present.  Musculoskeletal:     Cervical back: Normal range of motion.     Right lower leg: No edema.     Left lower leg: No edema.  Lymphadenopathy:     Cervical: No cervical adenopathy.     Upper Body:     Right upper body: No supraclavicular adenopathy.     Left upper body: No supraclavicular adenopathy.  Skin:    General: Skin is warm and dry.  Neurological:     General: No focal deficit present.     Mental Status: She is alert and oriented to person, place, and time. Mental status is at baseline.     Sensory: Sensation is intact.     Motor: Motor function is intact. No weakness.     Deep Tendon Reflexes: Reflexes are normal and symmetric.  Psychiatric:         Attention and Perception: Attention normal.        Mood and Affect: Mood normal.        Speech: Speech normal.        Behavior: Behavior normal.        Thought Content: Thought content normal.        Cognition and Memory: Cognition normal.        Judgment: Judgment normal.     Current Outpatient Medications  Medication Instructions   ALPRAZolam  (XANAX ) 0.5 mg, Oral, 2 times daily PRN   cetirizine (ZYRTEC) 10 mg, Daily   chlorthalidone  (HYGROTON ) 25 mg, Oral, Daily   Coenzyme Q10 (  CO Q-10 PO) 1 capsule, Daily   COLLAGEN PO 1 Scoop, Daily   losartan  (COZAAR ) 100 mg, Oral, Daily   metoprolol  succinate (TOPROL -XL) 50 MG 24 hr tablet TAKE ONE TABLET BY MOUTH ONCE DAILY AFTER a meal   Multiple Vitamin (MULTIVITAMIN) capsule 1 capsule, Daily   NON FORMULARY 1 Scoop, Daily   potassium chloride  SA (KLOR-CON  M) 20 MEQ tablet 20 mEq, Oral, 2 times daily   rosuvastatin  (CRESTOR ) 20 mg, Oral, Daily   vitamin D3 2,000 Units, Daily   Past Medical History:  Diagnosis Date   Arthritis    Cancer (HCC)    melanoma   Dyslipidemia    GERD (gastroesophageal reflux disease)    HSV-1 infection    Hypertension    Meniere's disease    Pneumonia    Medical/Surgical History Narrative:  Allergic/Intolerant to: Allergies[1]  Past Surgical History:  Procedure Laterality Date   Cervical cryosurgery  2018   NASAL SEPTUM SURGERY  1971   TOTAL KNEE ARTHROPLASTY Right 01/30/2021   Procedure: TOTAL KNEE ARTHROPLASTY;  Surgeon: Ernie Cough, MD;  Location: [facility];  Service: Orthopedics;  Laterality: Right;   TOTAL KNEE ARTHROPLASTY Left 02/05/2022   Procedure: TOTAL KNEE ARTHROPLASTY;  Surgeon: Ernie Cough, MD;  Location: [facility];  Service: Orthopedics;  Laterality: Left;   WISDOM TOOTH EXTRACTION     73 yo   Family History  Problem Relation Age of Onset   ALS Father    Heart disease Mother        Congenital , died post op.     Family History Narrative: Family history: Father died at age 24 of ALS  but also had colon cancer.  Mother with history of open heart surgery 1969 for congenital heart disease and also had CABG.  1 sister in good health.   Social History   Social History Narrative   Lives with husband.    Social history: Married with 2 adult children.  Does not smoke.  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  Graduated from [employer].  Social alcohol consumption.     Most Recent Health Risks Assessment:   Most Recent Social Determinants of Health (Including Hx of Tobacco, Alcohol, and Drug Use) SDOH Screenings   Food Insecurity: No Food Insecurity (01/14/2024)  Housing: Low Risk (01/14/2024)  Transportation Needs: No Transportation Needs (01/14/2024)  Utilities: Not At Risk (11/30/2022)  Alcohol Screen: Low Risk (01/14/2024)  Depression (PHQ2-9): Low Risk (01/16/2024)  Financial Resource Strain: Low Risk (01/14/2024)  Physical Activity: Insufficiently Active (01/14/2024)  Social Connections: Socially Integrated (01/14/2024)  Stress: Stress Concern Present (01/14/2024)  Tobacco Use: Low Risk (01/16/2024)  Health Literacy: Adequate Health Literacy (11/30/2022)   Social History[2] Most Recent Functional Status Assessment:    01/14/2024    5:51 PM  In your present state of health, do you have any difficulty performing the following activities:  Hearing? 1   Vision? 0      Manually entered by patient   Most Recent Fall Risk Assessment:    01/16/2024   11:06 AM  Fall Risk   Falls in the past year? 0  Number falls in past yr: 0  Injury with Fall? 0  Risk for fall due to : No Fall Risks  Follow up Falls prevention discussed;Education provided;Falls evaluation completed   Most Recent Anxiety/Depression Screenings:    01/16/2024   11:06 AM 12/03/2022   11:03 AM  PHQ 2/9 Scores  PHQ -  2 Score 0 0  PHQ- 9 Score 0     Most Recent Cognitive Screening:    12/03/2022   11:03 AM  6CIT Screen  What Year? 0 points  What month? 0  points  What time? 0 points  Count back from 20 0 points  Months in reverse 0 points  Repeat phrase 0 points  Total Score 0 points    Results:  Studies Obtained And Personally Reviewed By Me:  02/05/2023 mammogram No mammographic evidence of malignancy. Repeat in one year.    Labs:  CBC w/ Differential Lab Results  Component Value Date   WBC 5.3 12/22/2023   RBC 4.05 12/22/2023   HGB 12.5 12/22/2023   HCT 38.3 12/22/2023   PLT 232 12/22/2023   MCV 94.6 12/22/2023   MCH 30.9 12/22/2023   MCHC 32.6 12/22/2023   RDW 13.3 12/22/2023   MPV 11.0 12/22/2023   LYMPHSABS 1,463 11/08/2021   MONOABS 484 10/27/2015   BASOSABS 42 12/22/2023    Comprehensive Metabolic Panel Lab Results  Component Value Date   NA 141 12/22/2023   K 4.5 12/22/2023   CL 107 12/22/2023   CO2 25 12/22/2023   GLUCOSE 127 (H) 12/22/2023   BUN 33 (H) 12/22/2023   CREATININE 1.22 (H) 12/22/2023   CALCIUM  10.3 12/22/2023   PROT 7.1 12/22/2023   ALBUMIN 4.3 01/17/2021   AST 20 12/22/2023   ALT 18 12/22/2023   ALKPHOS 42 01/17/2021   BILITOT 0.6 12/22/2023   EGFR 47 (L) 12/22/2023   GFRNONAA 59 (L) 02/05/2022   Lipid Panel  Lab Results  Component Value Date   CHOL 161 12/22/2023   HDL 64 12/22/2023   LDLCALC 77 12/22/2023   TRIG 118 12/22/2023   A1c Lab Results  Component Value Date   HGBA1C 5.8 (H) 12/22/2023    TSH Lab Results  Component Value Date   TSH 3.59 12/22/2023    Assessment & Plan:   Orders Placed This Encounter  Procedures   Ambulatory referral to Gastroenterology    Referral Priority:   Routine    Referral Type:   Consultation    Referral Reason:   Specialty Services Required    Number of Visits Requested:   1   POCT URINALYSIS DIP (CLINITEK)    Hypertension: treated with losartan  100 mg daily, metoprolol  succinate 50 mg daily, chlorthalidone  25 mg daily. Blood pressure is normal at 130/70.    Hyperlipidemia: treated with rosuvastatin  20 mg daily. 12/22/2023  lipid panel normal.    Anxiety: treated with alprazolam  0.5 mg twice daily as needed.  She has had Covid a total of 3 times, the last time being October of 2025.    She has a history of Mnire's disease which is stable and in remission.  History of Herpes simplex type II.  She takes potassium supplement due to hypokalemia from diuretic therapy.   allergic urticaria and allergic rhinitis: seen by Dr. Kozlow.   glucose intolerance: 12/22/2023 HgbA1c 5.8%.      02/05/2023 mammogram No mammographic evidence of malignancy. Repeat in one year.    Health Maintenance: Mammogram scheduled for February. Colonoscopy due.   Referred to Gastroenterology.       Annual Wellness Visit done today including the all of the following: Reviewed patient's Family Medical History Reviewed patient's SDOH and reviewed tobacco, alcohol, and drug use.  Reviewed and updated list of patient's medical providers Assessment of cognitive impairment was done Assessed patient's functional ability Established a written schedule for  health screening services Health Risk Assessent Completed and Reviewed  Discussed health benefits of physical activity, and encouraged her to engage in regular exercise appropriate for her age and condition.    I,Makayla C Reid,acting as a scribe for Ronal JINNY Hailstone, MD.,have documented all relevant documentation on the behalf of Ronal JINNY Hailstone, MD,as directed by  Ronal JINNY Hailstone, MD while in the presence of Ronal JINNY Hailstone, MD.  I, Ronal JINNY Hailstone, MD, have reviewed all documentation for and agree with the above Annual Wellness Visit documentation.  Ronal JINNY Hailstone, MD Internal Medicine 01/16/2024     [1]  Allergies Allergen Reactions   Erythromycin     Unknown reaction   Macrobid  [Nitrofurantoin Macrocrystal]     Unknown reaction   Sulfa Antibiotics    Penicillins Rash    Tolerated ancef  well.    Thimerosol [Thimerosal (Thiomersal)] Rash  [2]  Social History Tobacco Use   Smoking  status: Never   Smokeless tobacco: Never  Vaping Use   Vaping status: Never Used  Substance Use Topics   Alcohol use: Yes    Comment: social   Drug use: No   "

## 2024-01-16 ENCOUNTER — Encounter: Payer: Self-pay | Admitting: Internal Medicine

## 2024-01-16 ENCOUNTER — Ambulatory Visit: Admitting: Internal Medicine

## 2024-01-16 VITALS — BP 130/70 | HR 71 | Ht 61.5 in | Wt 165.0 lb

## 2024-01-16 DIAGNOSIS — L500 Allergic urticaria: Secondary | ICD-10-CM

## 2024-01-16 DIAGNOSIS — E7800 Pure hypercholesterolemia, unspecified: Secondary | ICD-10-CM

## 2024-01-16 DIAGNOSIS — E785 Hyperlipidemia, unspecified: Secondary | ICD-10-CM

## 2024-01-16 DIAGNOSIS — J309 Allergic rhinitis, unspecified: Secondary | ICD-10-CM | POA: Diagnosis not present

## 2024-01-16 DIAGNOSIS — Z96652 Presence of left artificial knee joint: Secondary | ICD-10-CM

## 2024-01-16 DIAGNOSIS — Z0001 Encounter for general adult medical examination with abnormal findings: Secondary | ICD-10-CM | POA: Diagnosis not present

## 2024-01-16 DIAGNOSIS — R7302 Impaired glucose tolerance (oral): Secondary | ICD-10-CM

## 2024-01-16 DIAGNOSIS — F419 Anxiety disorder, unspecified: Secondary | ICD-10-CM

## 2024-01-16 DIAGNOSIS — J301 Allergic rhinitis due to pollen: Secondary | ICD-10-CM

## 2024-01-16 DIAGNOSIS — H8109 Meniere's disease, unspecified ear: Secondary | ICD-10-CM

## 2024-01-16 DIAGNOSIS — Z800 Family history of malignant neoplasm of digestive organs: Secondary | ICD-10-CM

## 2024-01-16 DIAGNOSIS — Z96653 Presence of artificial knee joint, bilateral: Secondary | ICD-10-CM

## 2024-01-16 DIAGNOSIS — I10 Essential (primary) hypertension: Secondary | ICD-10-CM

## 2024-01-16 DIAGNOSIS — E876 Hypokalemia: Secondary | ICD-10-CM

## 2024-01-16 DIAGNOSIS — Z96651 Presence of right artificial knee joint: Secondary | ICD-10-CM

## 2024-01-16 DIAGNOSIS — Z0000 Encounter for general adult medical examination without abnormal findings: Secondary | ICD-10-CM

## 2024-01-16 LAB — POCT URINALYSIS DIP (CLINITEK)
Bilirubin, UA: NEGATIVE
Blood, UA: NEGATIVE
Glucose, UA: NEGATIVE mg/dL
Ketones, POC UA: NEGATIVE mg/dL
Leukocytes, UA: NEGATIVE
Nitrite, UA: NEGATIVE
POC PROTEIN,UA: NEGATIVE
Spec Grav, UA: 1.015
Urobilinogen, UA: 0.2 U/dL
pH, UA: 6

## 2024-01-16 NOTE — Progress Notes (Unsigned)
 "  Chief Complaint  Patient presents with   Annual Exam   Medicare Wellness     Subjective:   Sally Hopkins is a 74 y.o. female who presents for a Medicare Annual Wellness Visit.  Visit info / Clinical Intake: Medicare Wellness Visit Type:: Subsequent Annual Wellness Visit Persons participating in visit and providing information:: patient Medicare Wellness Visit Mode:: In-person (required for WTM) Interpreter Needed?: No Pre-visit prep was completed: yes AWV questionnaire completed by patient prior to visit?: yes Date:: 01/14/24 Living arrangements:: lives with spouse/significant other Patient's Overall Health Status Rating: good Typical amount of pain: none Does pain affect daily life?: no Are you currently prescribed opioids?: no  Dietary Habits and Nutritional Risks How many meals a day?: 3 Eats fruit and vegetables daily?: yes Most meals are obtained by: preparing own meals In the last 2 weeks, have you had any of the following?: none Diabetic:: no  Functional Status Activities of Daily Living (to include ambulation/medication): Independent Ambulation: Independent Medication Administration: Independent Home Management (perform basic housework or laundry): Independent Manage your own finances?: yes Primary transportation is: driving Concerns about vision?: no *vision screening is required for WTM* Concerns about hearing?: no  Fall Screening Falls in the past year?: 0 Number of falls in past year: 0 Was there an injury with Fall?: 0 Fall Risk Category Calculator: 0 Patient Fall Risk Level: Low Fall Risk  Fall Risk Patient at Risk for Falls Due to: No Fall Risks Fall risk Follow up: Falls prevention discussed; Education provided; Falls evaluation completed  Home and Transportation Safety: All rugs have non-skid backing?: yes All stairs or steps have railings?: yes Grab bars in the bathtub or shower?: (!) no Have non-skid surface in bathtub or shower?: (!)  no Good home lighting?: yes Regular seat belt use?: yes Hospital stays in the last year:: no  Cognitive Assessment Difficulty concentrating, remembering, or making decisions? : no Will 6CIT or Mini Cog be Completed: no 6CIT or Mini Cog Declined: patient alert, oriented, able to answer questions appropriately and recall recent events  Advance Directives (For Healthcare) Does Patient Have a Medical Advance Directive?: No Would patient like information on creating a medical advance directive?: No - Patient declined  Reviewed/Updated  Reviewed/Updated: Reviewed All (Medical, Surgical, Family, Medications, Allergies, Care Teams, Patient Goals)    Allergies (verified) Erythromycin, Macrobid  [nitrofurantoin macrocrystal], Sulfa antibiotics, Penicillins, and Thimerosol [thimerosal (thiomersal)]   Current Medications (verified) Outpatient Encounter Medications as of 01/16/2024  Medication Sig   ALPRAZolam  (XANAX ) 0.5 MG tablet Take 1 tablet (0.5 mg total) by mouth 2 (two) times daily as needed for anxiety.   cetirizine (ZYRTEC) 10 MG tablet Take 10 mg by mouth daily.   chlorthalidone  (HYGROTON ) 25 MG tablet TAKE ONE TABLET BY MOUTH ONCE DAILY   Cholecalciferol (VITAMIN D3) 50 MCG (2000 UT) CAPS Take 2,000 Units by mouth daily.   Coenzyme Q10 (CO Q-10 PO) Take 1 capsule by mouth daily.   COLLAGEN PO Take 1 Scoop by mouth daily.   losartan  (COZAAR ) 100 MG tablet TAKE ONE TABLET BY MOUTH DAILY   metoprolol  succinate (TOPROL -XL) 50 MG 24 hr tablet TAKE ONE TABLET BY MOUTH ONCE DAILY AFTER a meal   Multiple Vitamin (MULTIVITAMIN) capsule Take 1 capsule by mouth daily.   NON FORMULARY Take 1 Scoop by mouth daily. Slim Microbiome activating Green coffee bean, garcinia cambogia, mulberry extract, alpha lipoic acid   potassium chloride  SA (KLOR-CON  M) 20 MEQ tablet TAKE ONE TABLET BY MOUTH TWICE  DAILY   rosuvastatin  (CRESTOR ) 20 MG tablet TAKE ONE TABLET BY MOUTH DAILY   [DISCONTINUED] doxycycline   (VIBRA -TABS) 100 MG tablet Take 1 tablet (100 mg total) by mouth 2 (two) times daily.   [DISCONTINUED] HYDROcodone  bit-homatropine (HYCODAN) 5-1.5 MG/5ML syrup Take 5 mLs by mouth every 8 (eight) hours as needed for cough. (Patient not taking: Reported on 07/24/2023)   No facility-administered encounter medications on file as of 01/16/2024.    History: Past Medical History:  Diagnosis Date   Allergy    Arthritis    Cancer (HCC)    melanoma   Dyslipidemia    GERD (gastroesophageal reflux disease)    HSV-1 infection    Hypertension    Meniere's disease    Pneumonia    Past Surgical History:  Procedure Laterality Date   Cervical cryosurgery  2018   NASAL SEPTUM SURGERY  1971   TOTAL KNEE ARTHROPLASTY Right 01/30/2021   Procedure: TOTAL KNEE ARTHROPLASTY;  Surgeon: Ernie Cough, MD;  Location: [facility];  Service: Orthopedics;  Laterality: Right;   TOTAL KNEE ARTHROPLASTY Left 02/05/2022   Procedure: TOTAL KNEE ARTHROPLASTY;  Surgeon: Ernie Cough, MD;  Location: [facility];  Service: Orthopedics;  Laterality: Left;   WISDOM TOOTH EXTRACTION     74 yo   Family History  Problem Relation Age of Onset   ALS Father    Heart disease Mother        Congenital , died post op.     Asthma Maternal Grandmother    Social History   Occupational History   Not on file  Tobacco Use   Smoking status: Never   Smokeless tobacco: Never  Vaping Use   Vaping status: Never Used  Substance and Sexual Activity   Alcohol use: Yes    Alcohol/week: 5.0 standard drinks of alcohol    Types: 5 Glasses of wine per week    Comment: social   Drug use: No   Sexual activity: Not on file   Tobacco Counseling Counseling given: Not Answered  SDOH Screenings   Food Insecurity: No Food Insecurity (01/16/2024)  Housing: Low Risk (01/16/2024)  Transportation Needs: No Transportation Needs (01/16/2024)  Utilities: Not At Risk (01/16/2024)  Alcohol Screen: Low Risk (01/16/2024)  Depression (PHQ2-9): Low Risk (01/16/2024)   Financial Resource Strain: Low Risk (01/16/2024)  Physical Activity: Insufficiently Active (01/16/2024)  Social Connections: Socially Integrated (01/16/2024)  Stress: Stress Concern Present (01/16/2024)  Tobacco Use: Low Risk (01/16/2024)  Health Literacy: Adequate Health Literacy (01/16/2024)   See flowsheets for full screening details  Depression Screen PHQ 2 & 9 Depression Scale- Over the past 2 weeks, how often have you been bothered by any of the following problems? Little interest or pleasure in doing things: 0 Feeling down, depressed, or hopeless (PHQ Adolescent also includes...irritable): 0 PHQ-2 Total Score: 0 Trouble falling or staying asleep, or sleeping too much: 0 Feeling tired or having little energy: 0 Poor appetite or overeating (PHQ Adolescent also includes...weight loss): 0 Feeling bad about yourself - or that you are a failure or have let yourself or your family down: 0 Trouble concentrating on things, such as reading the newspaper or watching television (PHQ Adolescent also includes...like school work): 0 Moving or speaking so slowly that other people could have noticed. Or the opposite - being so fidgety or restless that you have been moving around a lot more than usual: 0 Thoughts that you would be better off dead, or of hurting yourself in some way: 0 PHQ-9 Total Score:  0 If you checked off any problems, how difficult have these problems made it for you to do your work, take care of things at home, or get along with other people?: Not difficult at all     Goals Addressed               This Visit's Progress     DIET - REDUCE CALORIE INTAKE (pt-stated)               Objective:    Today's Vitals   01/16/24 1058  BP: 130/70  Pulse: 71  SpO2: 97%  Weight: 165 lb (74.8 kg)  Height: 5' 1.5 (1.562 m)  PainSc: 0-No pain   Body mass index is 30.67 kg/m.  Hearing/Vision screen No results found. Immunizations and Health Maintenance Health Maintenance  Topic  Date Due   Colonoscopy  08/11/2012   COVID-19 Vaccine (5 - 2025-26 season) 09/08/2023   Mammogram  02/05/2024   Medicare Annual Wellness (AWV)  01/15/2025   DTaP/Tdap/Td (4 - Td or Tdap) 07/07/2033   Pneumococcal Vaccine: 50+ Years  Completed   Influenza Vaccine  Completed   Bone Density Scan  Completed   Zoster Vaccines- Shingrix  Completed   Meningococcal B Vaccine  Aged Out   Hepatitis C Screening  Discontinued        Assessment/Plan:  This is a routine wellness examination for Sally Hopkins.  Patient Care Team: Perri Ronal PARAS, MD as PCP - General (Internal Medicine) Lavona Agent, MD as PCP - Cardiology (Cardiology)  I have personally reviewed and noted the following in the patients chart:   Medical and social history Use of alcohol, tobacco or illicit drugs  Current medications and supplements including opioid prescriptions. Functional ability and status Nutritional status Physical activity Advanced directives List of other physicians Hospitalizations, surgeries, and ER visits in previous 12 months Vitals Screenings to include cognitive, depression, and falls Referrals and appointments  Orders Placed This Encounter  Procedures   Ambulatory referral to Gastroenterology    Referral Priority:   Routine    Referral Type:   Consultation    Referral Reason:   Specialty Services Required    Number of Visits Requested:   1   POCT URINALYSIS DIP (CLINITEK)   In addition, I have reviewed and discussed with patient certain preventive protocols, quality metrics, and best practice recommendations. A written personalized care plan for preventive services as well as general preventive health recommendations were provided to patient.   Ledon Weihe, CMA   01/16/2024   Return in 1 year (on 01/15/2025).  After Visit Summary: (In Person-Printed) AVS printed and given to the patient  Nurse Notes: none  "

## 2024-01-16 NOTE — Patient Instructions (Signed)
 Sally Hopkins,  Thank you for taking the time for your Medicare Wellness Visit. I appreciate your continued commitment to your health goals. Please review the care plan we discussed, and feel free to reach out if I can assist you further.  Please note that Annual Wellness Visits do not include a physical exam. Some assessments may be limited, especially if the visit was conducted virtually. If needed, we may recommend an in-person follow-up with your provider.  Ongoing Care Seeing your primary care provider every 3 to 6 months helps us  monitor your health and provide consistent, personalized care.   Referrals If a referral was made during today's visit and you haven't received any updates within two weeks, please contact the referred provider directly to check on the status.  Recommended Screenings:  Health Maintenance  Topic Date Due   Colon Cancer Screening  08/11/2012   COVID-19 Vaccine (5 - 2025-26 season) 09/08/2023   Medicare Annual Wellness Visit  12/03/2023   Breast Cancer Screening  02/05/2024   DTaP/Tdap/Td vaccine (4 - Td or Tdap) 07/07/2033   Pneumococcal Vaccine for age over 11  Completed   Flu Shot  Completed   Osteoporosis screening with Bone Density Scan  Completed   Zoster (Shingles) Vaccine  Completed   Meningitis B Vaccine  Aged Out   Hepatitis C Screening  Discontinued       01/16/2024    4:56 PM  Advanced Directives  Does Patient Have a Medical Advance Directive? No  Would patient like information on creating a medical advance directive? No - Patient declined    Vision: Annual vision screenings are recommended for early detection of glaucoma, cataracts, and diabetic retinopathy. These exams can also reveal signs of chronic conditions such as diabetes and high blood pressure.  Dental: Annual dental screenings help detect early signs of oral cancer, gum disease, and other conditions linked to overall health, including heart disease and diabetes.  Please see  the attached documents for additional preventive care recommendations.   Labs are stable. Continue diet and exercise regimen. Continue current meds Return in one year or as needed.

## 2024-02-06 ENCOUNTER — Other Ambulatory Visit

## 2024-02-06 DIAGNOSIS — R799 Abnormal finding of blood chemistry, unspecified: Secondary | ICD-10-CM

## 2024-02-06 DIAGNOSIS — R7989 Other specified abnormal findings of blood chemistry: Secondary | ICD-10-CM

## 2024-02-13 ENCOUNTER — Other Ambulatory Visit

## 2024-02-13 DIAGNOSIS — R7989 Other specified abnormal findings of blood chemistry: Secondary | ICD-10-CM

## 2024-02-13 DIAGNOSIS — R799 Abnormal finding of blood chemistry, unspecified: Secondary | ICD-10-CM

## 2024-02-17 ENCOUNTER — Other Ambulatory Visit
